# Patient Record
Sex: Female | Born: 1935 | Race: White | Hispanic: No | Smoking: Never smoker
Health system: Southern US, Community
[De-identification: ages and names within clinical notes are randomized; demographics above are authoritative.]

## PROBLEM LIST (undated history)

## (undated) DIAGNOSIS — E7800 Pure hypercholesterolemia, unspecified: Secondary | ICD-10-CM

## (undated) DIAGNOSIS — I10 Essential (primary) hypertension: Secondary | ICD-10-CM

## (undated) DIAGNOSIS — I219 Acute myocardial infarction, unspecified: Secondary | ICD-10-CM

## (undated) DIAGNOSIS — F419 Anxiety disorder, unspecified: Secondary | ICD-10-CM

## (undated) HISTORY — PX: OTHER SURGICAL HISTORY: SHX169

## (undated) HISTORY — PX: ABDOMINAL HYSTERECTOMY: SHX81

---

## 1999-08-08 ENCOUNTER — Ambulatory Visit (HOSPITAL_COMMUNITY): Admission: RE | Admit: 1999-08-08 | Discharge: 1999-08-08 | Payer: Self-pay | Admitting: Gastroenterology

## 2011-12-31 ENCOUNTER — Other Ambulatory Visit: Payer: Self-pay

## 2011-12-31 ENCOUNTER — Emergency Department (HOSPITAL_BASED_OUTPATIENT_CLINIC_OR_DEPARTMENT_OTHER)
Admission: EM | Admit: 2011-12-31 | Discharge: 2012-01-01 | Disposition: A | Discharge status: Home / Self Care | Payer: Medicare Other | Attending: Emergency Medicine | Admitting: Emergency Medicine

## 2011-12-31 ENCOUNTER — Encounter (HOSPITAL_BASED_OUTPATIENT_CLINIC_OR_DEPARTMENT_OTHER): Payer: Self-pay | Admitting: *Deleted

## 2011-12-31 DIAGNOSIS — Z882 Allergy status to sulfonamides status: Secondary | ICD-10-CM | POA: Insufficient documentation

## 2011-12-31 DIAGNOSIS — Z880 Allergy status to penicillin: Secondary | ICD-10-CM | POA: Insufficient documentation

## 2011-12-31 DIAGNOSIS — R002 Palpitations: Secondary | ICD-10-CM | POA: Insufficient documentation

## 2011-12-31 DIAGNOSIS — I252 Old myocardial infarction: Secondary | ICD-10-CM | POA: Insufficient documentation

## 2011-12-31 DIAGNOSIS — Z881 Allergy status to other antibiotic agents status: Secondary | ICD-10-CM | POA: Insufficient documentation

## 2011-12-31 DIAGNOSIS — F411 Generalized anxiety disorder: Secondary | ICD-10-CM | POA: Insufficient documentation

## 2011-12-31 DIAGNOSIS — I10 Essential (primary) hypertension: Secondary | ICD-10-CM | POA: Insufficient documentation

## 2011-12-31 HISTORY — DX: Essential (primary) hypertension: I10

## 2011-12-31 HISTORY — DX: Anxiety disorder, unspecified: F41.9

## 2011-12-31 HISTORY — DX: Pure hypercholesterolemia, unspecified: E78.00

## 2011-12-31 HISTORY — DX: Acute myocardial infarction, unspecified: I21.9

## 2011-12-31 NOTE — ED Notes (Signed)
Here with c.o fast heart beat. Heart rate is 82 at triage. States she feels nervous and may be having a panic attack.

## 2012-01-01 LAB — CBC WITH DIFFERENTIAL/PLATELET
Basophils Absolute: 0 10*3/uL (ref 0.0–0.1)
HCT: 34.3 % — ABNORMAL LOW (ref 36.0–46.0)
Lymphocytes Relative: 22 % (ref 12–46)
Monocytes Absolute: 0.8 10*3/uL (ref 0.1–1.0)
Neutro Abs: 5.1 10*3/uL (ref 1.7–7.7)
Platelets: 179 10*3/uL (ref 150–400)
RDW: 11.6 % (ref 11.5–15.5)
WBC: 8.3 10*3/uL (ref 4.0–10.5)

## 2012-01-01 LAB — TROPONIN I: Troponin I: <0.3 ng/mL (ref <0.30)

## 2012-01-01 LAB — BASIC METABOLIC PANEL
Chloride: 101 mEq/L (ref 96–112)
Creatinine, Ser: 0.9 mg/dL (ref 0.50–1.10)
GFR calc Af Amer: 70 mL/min — ABNORMAL LOW (ref >90)
Sodium: 137 mEq/L (ref 135–145)

## 2012-01-01 NOTE — ED Notes (Signed)
Pt left prior to receiving discharge instructions- family present to drive

## 2012-01-01 NOTE — ED Provider Notes (Signed)
History   This chart was scribed for Toni Seamen, MD by Sofie Rower. The patient was seen in room MH06/MH06 and the patient's care was started at 12:40AM.    CSN: [id number]  Arrival date & time 12/31/11  2237   None     Chief Complaint  Patient presents with  . Palpitations    (Consider location/radiation/quality/duration/timing/severity/associated sxs/prior treatment) Patient is a 78 y.o. female presenting with palpitations. The history is provided by the patient. No language interpreter was used.  Palpitations  This is a new problem. The current episode started 3 to 5 hours ago. The problem occurs rarely. The problem has been resolved. The problem is associated with an unknown factor. Pertinent negatives include no fever and no dizziness. She has tried nothing for the symptoms. The treatment provided no relief.    Toni Castaneda is a 78 y.o. female , with a hx of MI, who presents to the Emergency Department complaining of sudden, progressively improving, tachycardia onset yesterday (9:00PM), with associated symptoms of nausea. The pt reports she experiencing a rapid heart rate yesterday, however, she did not measure her pulse rate. Modifying factors include taking metoprolol (25 mg) which provides moderate relief of the tachycardia. The pt has a hx of anxiety, cardiac stents, and hypertension. The pt denies chest pain, shortness of breath, nausea, vomiting, and sweats.   The pt does not smoke or drink alcohol.      Past Medical History  Diagnosis Date  . Hypertension   . High cholesterol   . MI (myocardial infarction)   . Anxiety     Past Surgical History  Procedure Date  . Abdominal hysterectomy   . Cardiac stents     No family history on file.  History  Substance Use Topics  . Smoking status: Never Smoker   . Smokeless tobacco: Not on file  . Alcohol Use: No    OB History    Grav Para Term Preterm Abortions TAB SAB Ect Mult Living                  Review  of Systems  Constitutional: Negative for fever.  Cardiovascular: Positive for palpitations.  Neurological: Negative for dizziness.  All other systems reviewed and are negative.    Allergies  Amoxicillin; Penicillins; Sulfa antibiotics; and Tetracyclines & related  Home Medications   Current Outpatient Rx  Name Route Sig Dispense Refill  . AMLODIPINE BESYLATE 2.5 MG PO TABS Oral Take 2.5 mg by mouth daily.    . ASPIRIN 81 MG PO TABS Oral Take 81 mg by mouth daily.    Marland Kitchen CETIRIZINE HCL 10 MG PO TABS Oral Take 10 mg by mouth daily.    Marland Kitchen DIPHENOXYLATE-ATROPINE 2.5-0.025 MG PO TABS Oral Take 1 tablet by mouth 4 (four) times daily as needed.    Marland Kitchen LISINOPRIL 40 MG PO TABS Oral Take 40 mg by mouth daily. Patient uses 20 mg in the am, and 20 mg in the pm.    . LORAZEPAM 1 MG PO TABS Oral Take 1 mg by mouth every 8 (eight) hours.    Marland Kitchen METOPROLOL TARTRATE 25 MG PO TABS Oral Take 25 mg by mouth 2 (two) times daily.    Marland Kitchen PANTOPRAZOLE SODIUM 40 MG PO TBEC Oral Take 40 mg by mouth daily.    Marland Kitchen ROSUVASTATIN CALCIUM 10 MG PO TABS Oral Take 10 mg by mouth daily.      BP 162/81  Pulse 82  Temp 97.9 F (36.6  C) (Oral)  Resp 18  SpO2 98%  Physical Exam  Nursing note and vitals reviewed. Constitutional: She appears well-developed and well-nourished.  HENT:  Head: Atraumatic.  Nose: Nose normal.  Eyes: Conjunctivae normal and EOM are normal. Pupils are equal, round, and reactive to light.  Neck: Normal range of motion.  Cardiovascular: Normal rate and normal heart sounds.   No murmur heard. Pulmonary/Chest: Effort normal and breath sounds normal.  Abdominal: Soft. Bowel sounds are normal. There is no tenderness.  Musculoskeletal: Normal range of motion.  Neurological: She is alert.  Skin: Skin is warm and dry.  Psychiatric: She has a normal mood and affect. Her behavior is normal.    ED Course  Procedures (including critical care time)  DIAGNOSTIC STUDIES: Oxygen Saturation is 98% on  room air, normal by my interpretation.    COORDINATION OF CARE:    12:44AM- EKG results and continued evaluation discussed. Pt agrees with treatment.      MDM   Nursing notes and vitals signs, including pulse oximetry, reviewed.  Summary of this visit's results, reviewed by myself:  Labs:  Results for orders placed during the hospital encounter of 12/31/11  TROPONIN I      Component Value Range   Troponin I <0.30  <0.30 ng/mL  BASIC METABOLIC PANEL      Component Value Range   Sodium 137  135 - 145 mEq/L   Potassium 4.0  3.5 - 5.1 mEq/L   Chloride 101  96 - 112 mEq/L   CO2 27  19 - 32 mEq/L   Glucose, Bld 111 (*) 70 - 99 mg/dL   BUN 15  6 - 23 mg/dL   Creatinine, Ser 1.61  0.50 - 1.10 mg/dL   Calcium 9.3  8.4 - 09.6 mg/dL   GFR calc non Af Amer 61 (*) >90 mL/min   GFR calc Af Amer 70 (*) >90 mL/min  CBC WITH DIFFERENTIAL      Component Value Range   WBC 8.3  4.0 - 10.5 K/uL   RBC 3.63 (*) 3.87 - 5.11 MIL/uL   Hemoglobin 11.8 (*) 12.0 - 15.0 g/dL   HCT 04.5 (*) 40.9 - 81.1 %   MCV 94.5  78.0 - 100.0 fL   MCH 32.5  26.0 - 34.0 pg   MCHC 34.4  30.0 - 36.0 g/dL   RDW 91.4  78.2 - 95.6 %   Platelets 179  150 - 400 K/uL   Neutrophils Relative 62  43 - 77 %   Neutro Abs 5.1  1.7 - 7.7 K/uL   Lymphocytes Relative 22  12 - 46 %   Lymphs Abs 1.8  0.7 - 4.0 K/uL   Monocytes Relative 10  3 - 12 %   Monocytes Absolute 0.8  0.1 - 1.0 K/uL   Eosinophils Relative 6 (*) 0 - 5 %   Eosinophils Absolute 0.5  0.0 - 0.7 K/uL   Basophils Relative 0  0 - 1 %   Basophils Absolute 0.0  0.0 - 0.1 K/uL   1:56 AM Asymptomatic in ED. Suspect SVT given history.   EKG Interpretation:  Date & Time: 12/31/2011 10:53 PM  Rate: 81  Rhythm: normal sinus rhythm  QRS Axis: normal  Intervals: normal  ST/T Wave abnormalities: normal  Conduction Disutrbances:none  Narrative Interpretation:   Old EKG Reviewed: none available        I personally performed the services described in  this documentation, which was scribed in my presence.  The  recorded information has been reviewed and considered.    Toni Seamen, MD 01/01/12 [phone]

## 2012-09-06 ENCOUNTER — Emergency Department (HOSPITAL_BASED_OUTPATIENT_CLINIC_OR_DEPARTMENT_OTHER): Payer: Medicare Other

## 2012-09-06 ENCOUNTER — Emergency Department (HOSPITAL_BASED_OUTPATIENT_CLINIC_OR_DEPARTMENT_OTHER)
Admission: EM | Admit: 2012-09-06 | Discharge: 2012-09-06 | Disposition: A | Discharge status: Home / Self Care | Payer: Medicare Other | Attending: Emergency Medicine | Admitting: Emergency Medicine

## 2012-09-06 ENCOUNTER — Encounter (HOSPITAL_BASED_OUTPATIENT_CLINIC_OR_DEPARTMENT_OTHER): Payer: Self-pay | Admitting: *Deleted

## 2012-09-06 DIAGNOSIS — Y929 Unspecified place or not applicable: Secondary | ICD-10-CM | POA: Insufficient documentation

## 2012-09-06 DIAGNOSIS — Z7982 Long term (current) use of aspirin: Secondary | ICD-10-CM | POA: Insufficient documentation

## 2012-09-06 DIAGNOSIS — Y9389 Activity, other specified: Secondary | ICD-10-CM | POA: Insufficient documentation

## 2012-09-06 DIAGNOSIS — X500XXA Overexertion from strenuous movement or load, initial encounter: Secondary | ICD-10-CM | POA: Insufficient documentation

## 2012-09-06 DIAGNOSIS — S93401A Sprain of unspecified ligament of right ankle, initial encounter: Secondary | ICD-10-CM

## 2012-09-06 DIAGNOSIS — Z79899 Other long term (current) drug therapy: Secondary | ICD-10-CM | POA: Insufficient documentation

## 2012-09-06 DIAGNOSIS — S93409A Sprain of unspecified ligament of unspecified ankle, initial encounter: Secondary | ICD-10-CM | POA: Insufficient documentation

## 2012-09-06 DIAGNOSIS — Z9861 Coronary angioplasty status: Secondary | ICD-10-CM | POA: Insufficient documentation

## 2012-09-06 DIAGNOSIS — I252 Old myocardial infarction: Secondary | ICD-10-CM | POA: Insufficient documentation

## 2012-09-06 DIAGNOSIS — Z880 Allergy status to penicillin: Secondary | ICD-10-CM | POA: Insufficient documentation

## 2012-09-06 DIAGNOSIS — E7800 Pure hypercholesterolemia, unspecified: Secondary | ICD-10-CM | POA: Insufficient documentation

## 2012-09-06 DIAGNOSIS — I10 Essential (primary) hypertension: Secondary | ICD-10-CM | POA: Insufficient documentation

## 2012-09-06 DIAGNOSIS — F411 Generalized anxiety disorder: Secondary | ICD-10-CM | POA: Insufficient documentation

## 2012-09-06 NOTE — ED Notes (Signed)
I placed an ASO on the patient's left ankle. There isn't anywhere to charge for an ASO in the charge capture, under the supplies.

## 2012-09-06 NOTE — ED Notes (Signed)
Pt states she injured her left ankle coming down the steps. Swelling and bruising noted. PMS intact.

## 2012-09-06 NOTE — ED Provider Notes (Signed)
History     CSN: [id number]  Arrival date & time 09/06/12  1626   First MD Initiated Contact with Patient 09/06/12 1754      Chief Complaint  Patient presents with  . Ankle Injury    (Consider location/radiation/quality/duration/timing/severity/associated sxs/prior treatment) Patient is a 79 y.o. female presenting with lower extremity injury. The history is provided by the patient. No language interpreter was used.  Ankle Injury This is a new problem. The current episode started today. The problem occurs constantly. The problem has been gradually worsening. Associated symptoms include joint swelling. Nothing aggravates the symptoms. She has tried nothing for the symptoms. The treatment provided moderate relief.   Pt complains of pain in her left ankle.  Pt reports she missed a step and turned ankle. Past Medical History  Diagnosis Date  . Hypertension   . High cholesterol   . MI (myocardial infarction)   . Anxiety     Past Surgical History  Procedure Laterality Date  . Abdominal hysterectomy    . Cardiac stents      History reviewed. No pertinent family history.  History  Substance Use Topics  . Smoking status: Never Smoker   . Smokeless tobacco: Not on file  . Alcohol Use: No    OB History   Grav Para Term Preterm Abortions TAB SAB Ect Mult Living                  Review of Systems  Musculoskeletal: Positive for joint swelling.  All other systems reviewed and are negative.    Allergies  Amoxicillin; Penicillins; Sulfa antibiotics; and Tetracyclines & related  Home Medications   Current Outpatient Rx  Name  Route  Sig  Dispense  Refill  . amLODipine (NORVASC) 2.5 MG tablet   Oral   Take 2.5 mg by mouth daily.         Marland Kitchen aspirin 81 MG tablet   Oral   Take 81 mg by mouth daily.         . cetirizine (ZYRTEC) 10 MG tablet   Oral   Take 10 mg by mouth daily.         . diphenoxylate-atropine (LOMOTIL) 2.5-0.025 MG per tablet   Oral   Take 1  tablet by mouth 4 (four) times daily as needed.         Marland Kitchen lisinopril (PRINIVIL,ZESTRIL) 40 MG tablet   Oral   Take 40 mg by mouth daily. Patient uses 20 mg in the am, and 20 mg in the pm.         . LORazepam (ATIVAN) 1 MG tablet   Oral   Take 1 mg by mouth every 8 (eight) hours.         . metoprolol tartrate (LOPRESSOR) 25 MG tablet   Oral   Take 25 mg by mouth 2 (two) times daily.         . pantoprazole (PROTONIX) 40 MG tablet   Oral   Take 40 mg by mouth daily.         . rosuvastatin (CRESTOR) 10 MG tablet   Oral   Take 10 mg by mouth daily.           BP 138/114  Pulse 74  Temp(Src) 98.4 F (36.9 C) (Oral)  Resp 20  Ht 5\' 4"  (1.626 m)  Wt 117 lb (53.071 kg)  BMI 20.07 kg/m2  SpO2 97%  Physical Exam  Nursing note and vitals reviewed. Constitutional: She appears well-developed and well-nourished.  HENT:  Head: Normocephalic.  Cardiovascular: Regular rhythm.   Musculoskeletal: She exhibits tenderness.  Swollen tender left ankle, from,  Bruised, nv and ns intact  Neurological: She is alert.  Skin: Skin is warm.    ED Course  Procedures (including critical care time)  Labs Reviewed - No data to display Dg Ankle Complete Left  09/06/2012   *RADIOLOGY REPORT*  Clinical Data: Lateral left ankle pain following a twisting injury today.  LEFT ANKLE COMPLETE - 3+ VIEW  Comparison: None.  Findings: Lateral soft tissue swelling.  No fracture, dislocation or effusion seen.  IMPRESSION: No fracture.   Original Report Authenticated By: Beckie Salts, M.D.     No diagnosis found.    MDM  Pt placed in aso.  Pt advised to follow up with Dr. Pearletha Forge for recheck in 1 week. Ice to area of pain        Elson Areas, [place] 09/06/12 1914

## 2012-09-07 NOTE — ED Provider Notes (Signed)
Medical screening examination/treatment/procedure(s) were performed by non-physician practitioner and as supervising physician I was immediately available for consultation/collaboration.  Clova Morlock M Tykeisha Peer, MD 09/07/12 1442 

## 2012-09-11 ENCOUNTER — Encounter: Payer: Self-pay | Admitting: Family Medicine

## 2012-09-11 ENCOUNTER — Ambulatory Visit (INDEPENDENT_AMBULATORY_CARE_PROVIDER_SITE_OTHER): Payer: Medicare Other | Admitting: Family Medicine

## 2012-09-11 VITALS — BP 112/71 | HR 62 | Ht 64.0 in | Wt 117.0 lb

## 2012-09-11 DIAGNOSIS — S99929A Unspecified injury of unspecified foot, initial encounter: Secondary | ICD-10-CM

## 2012-09-11 DIAGNOSIS — S99912A Unspecified injury of left ankle, initial encounter: Secondary | ICD-10-CM

## 2012-09-11 NOTE — Patient Instructions (Addendum)
You have a Grade 2 (likely 3) ankle sprain. Ice the area for 15 minutes at a time, 3-4 times a day Tylenol 500mg  1-2 tabs three times a day as needed for pain. Elevate above the level of your heart when possible Crutches if needed to help with walking Bear weight when tolerated Wear laceup ankle brace to help with stability while you recover from this injury. Come out of the brace twice a day to do Up/down and alphabet exercises 2-3 sets of each. Consider physical therapy for strengthening and balance exercises in the future. Follow up with me in 2 weeks for reevaluation.

## 2012-09-12 ENCOUNTER — Encounter: Payer: Self-pay | Admitting: Family Medicine

## 2012-09-12 DIAGNOSIS — S99912A Unspecified injury of left ankle, initial encounter: Secondary | ICD-10-CM | POA: Insufficient documentation

## 2012-09-12 NOTE — Progress Notes (Signed)
Subjective:    Patient ID: Toni Castaneda, female    DOB: 1933-05-12, 79 y.o.   MRN: [id number]  PCP: Dr. Derrell Lolling  HPI 79 yo F here for left ankle injury.  [place] She thought she was stepping forward onto level ground when in fact there was a 1 step dropoff. This caused her to invert left ankle. Heard a loud pop - could put some weight on it initially. + swelling and bruising. History of a bad sprain same ankle 22 years ago - was placed in a cast then and was on crutches for at least a couple months. She plans to get a walker to help with ambulation - not using crutches or anything now. Has an ASO - using for stability. Tylenol as needed for pain.  Past Medical History  Diagnosis Date  . Hypertension   . High cholesterol   . MI (myocardial infarction)   . Anxiety     Current Outpatient Prescriptions on File Prior to Visit  Medication Sig Dispense Refill  . amLODipine (NORVASC) 2.5 MG tablet Take 2.5 mg by mouth daily.      Marland Kitchen aspirin 81 MG tablet Take 81 mg by mouth daily.      Marland Kitchen lisinopril (PRINIVIL,ZESTRIL) 40 MG tablet Take 40 mg by mouth daily. Patient uses 20 mg in the am, and 20 mg in the pm.      . LORazepam (ATIVAN) 1 MG tablet Take 1 mg by mouth every 8 (eight) hours.      . pantoprazole (PROTONIX) 40 MG tablet Take 40 mg by mouth daily.      . rosuvastatin (CRESTOR) 10 MG tablet Take 10 mg by mouth daily.      . cetirizine (ZYRTEC) 10 MG tablet Take 10 mg by mouth daily.      . diphenoxylate-atropine (LOMOTIL) 2.5-0.025 MG per tablet Take 1 tablet by mouth 4 (four) times daily as needed.      . metoprolol tartrate (LOPRESSOR) 25 MG tablet Take 25 mg by mouth 2 (two) times daily.       No current facility-administered medications on file prior to visit.    Past Surgical History  Procedure Laterality Date  . Abdominal hysterectomy    . Cardiac stents      Allergies  Allergen Reactions  . Amoxicillin   . Codeine    . Penicillins   . Sulfa Antibiotics   . Tetracyclines & Related     History   Social History  . Marital Status: Widowed    Spouse Name: N/A    Number of Children: N/A  . Years of Education: N/A   Occupational History  . Not on file.   Social History Main Topics  . Smoking status: Never Smoker   . Smokeless tobacco: Not on file  . Alcohol Use: No  . Drug Use: No  . Sexually Active: Not on file   Other Topics Concern  . Not on file   Social History Narrative  . No narrative on file    History reviewed. No pertinent family history.  BP 112/71  Pulse 62  Ht 5\' 4"  (1.626 m)  Wt 117 lb (53.071 kg)  BMI 20.07 kg/m2  Review of Systems See HPI above.    Objective:   Physical Exam Gen: NAD  L ankle: Mod swelling and bruising laterally. Mod limitation ROM all directions. TTP greatest at ATFL though has diffuse pain dorsal foot, lateral ankle including  lateral malleolus but not 5th metatarsal, navicular, medial malleolus.  No fibular head TTP. 2+ painful ant drawer, talar tilt. + syndesmotic compression. Thompsons test negative. NV intact distally.    Assessment & Plan:  1. Left ankle injury - 2/2 severe ankle sprain.  Radiographs negative for fracture.  Start with ROM exercises.  Icing, elevation, tylenol.  Going to use walker to help with ambulation.  ASO for stability.  F/u in 2 weeks for reevaluation.  Consider PT, adding HEP at f/u if improving.

## 2012-09-12 NOTE — Assessment & Plan Note (Signed)
2/2 severe ankle sprain.  Radiographs negative for fracture.  Start with ROM exercises.  Icing, elevation, tylenol.  Going to use walker to help with ambulation.  ASO for stability.  F/u in 2 weeks for reevaluation.  Consider PT, adding HEP at f/u if improving.

## 2012-09-25 ENCOUNTER — Encounter: Payer: Self-pay | Admitting: Family Medicine

## 2012-09-25 ENCOUNTER — Ambulatory Visit (INDEPENDENT_AMBULATORY_CARE_PROVIDER_SITE_OTHER): Payer: Medicare Other | Admitting: Family Medicine

## 2012-09-25 VITALS — BP 114/70 | HR 76

## 2012-09-25 DIAGNOSIS — S99912D Unspecified injury of left ankle, subsequent encounter: Secondary | ICD-10-CM

## 2012-09-25 DIAGNOSIS — Z5189 Encounter for other specified aftercare: Secondary | ICD-10-CM

## 2012-09-25 NOTE — Patient Instructions (Addendum)
You have a Grade 3 ankle sprain. Ice the area for 15 minutes at a time, 3-4 times a day as needed. Tylenol 500mg  1-2 tabs three times a day as needed for pain. Elevate above the level of your heart when possible Wear laceup ankle brace to help with stability for next 3 weeks when up and walking around. Start theraband strengthening exercises 3 sets of 10 each direction once a day. Can start some of the balance exercises in the handout too in about 1 week. Follow up with me in 4 weeks for reevaluation.

## 2012-09-26 ENCOUNTER — Encounter: Payer: Self-pay | Admitting: Family Medicine

## 2012-09-26 NOTE — Assessment & Plan Note (Signed)
2/2 severe ankle sprain.  Radiographs negative for fracture.  ROM much better and swelling down as well.  Continue tylenol as needed.  ASO for stability.  Start strengthening exercises which were given today.  Wait a week before doing balance exercises.  F/u in 4 weeks for reevaluation.

## 2012-09-26 NOTE — Progress Notes (Signed)
Subjective:    Patient ID: Toni Castaneda, female    DOB: May 12, 1933, 79 y.o.   MRN: [id number]  PCP: Dr. Derrell Lolling  HPI  79 yo F here for f/u left ankle injury.  5/29: Patient [place] She thought she was stepping forward onto level ground when in fact there was a 1 step dropoff. This caused her to invert left ankle. Heard a loud pop - could put some weight on it initially. + swelling and bruising. History of a bad sprain same ankle 22 years ago - was placed in a cast then and was on crutches for at least a couple months. She plans to get a walker to help with ambulation - not using crutches or anything now. Has an ASO - using for stability. Tylenol as needed for pain.  6/12: Patient reports she does feel improved from last visit. Still with pain lateral and medial ankle. Doing home ROM exercises. Elevating and using ankle brace. No longer icing or using crutches. Takes tylenol as needed.  Past Medical History  Diagnosis Date  . Hypertension   . High cholesterol   . MI (myocardial infarction)   . Anxiety     Current Outpatient Prescriptions on File Prior to Visit  Medication Sig Dispense Refill  . amLODipine (NORVASC) 2.5 MG tablet Take 2.5 mg by mouth daily.      Marland Kitchen aspirin 81 MG tablet Take 81 mg by mouth daily.      . cetirizine (ZYRTEC) 10 MG tablet Take 10 mg by mouth daily.      . citalopram (CELEXA) 10 MG tablet       . diphenoxylate-atropine (LOMOTIL) 2.5-0.025 MG per tablet Take 1 tablet by mouth 4 (four) times daily as needed.      Marland Kitchen lisinopril (PRINIVIL,ZESTRIL) 40 MG tablet Take 40 mg by mouth daily. Patient uses 20 mg in the am, and 20 mg in the pm.      . LORazepam (ATIVAN) 1 MG tablet Take 1 mg by mouth every 8 (eight) hours.      . metoprolol tartrate (LOPRESSOR) 25 MG tablet Take 25 mg by mouth 2 (two) times daily.      . pantoprazole (PROTONIX) 40 MG tablet Take 40 mg by mouth daily.      Marland Kitchen PREMARIN vaginal cream        . rosuvastatin (CRESTOR) 10 MG tablet Take 10 mg by mouth daily.       No current facility-administered medications on file prior to visit.    Past Surgical History  Procedure Laterality Date  . Abdominal hysterectomy    . Cardiac stents      Allergies  Allergen Reactions  . Amoxicillin   . Codeine   . Penicillins   . Sulfa Antibiotics   . Tetracyclines & Related     History   Social History  . Marital Status: Widowed    Spouse Name: N/A    Number of Children: N/A  . Years of Education: N/A   Occupational History  . Not on file.   Social History Main Topics  . Smoking status: Never Smoker   . Smokeless tobacco: Not on file  . Alcohol Use: No  . Drug Use: No  . Sexually Active: Not on file   Other Topics Concern  . Not on file   Social History Narrative  . No narrative on file    History reviewed. No pertinent family history.  BP 114/70  Pulse 76  Review of Systems  See HPI above.    Objective:   Physical Exam  Gen: NAD  L ankle: Minimal swelling and bruising laterally - much improved. FROM - also improved. TTP ATFL, over medial and lateral ankle below and post to malleoli as well.  No malleolus, base 5th metatarsal, navicular TTP.  No fibular head TTP. 2+ ant drawer, talar tilt but less pain. + syndesmotic compression. Thompsons test negative. NV intact distally.    Assessment & Plan:  1. Left ankle injury - 2/2 severe ankle sprain.  Radiographs negative for fracture.  ROM much better and swelling down as well.  Continue tylenol as needed.  ASO for stability.  Start strengthening exercises which were given today.  Wait a week before doing balance exercises.  F/u in 4 weeks for reevaluation.

## 2012-10-27 ENCOUNTER — Encounter: Payer: Self-pay | Admitting: Family Medicine

## 2012-10-27 ENCOUNTER — Ambulatory Visit (INDEPENDENT_AMBULATORY_CARE_PROVIDER_SITE_OTHER): Payer: Medicare Other | Admitting: Family Medicine

## 2012-10-27 VITALS — BP 121/77 | HR 65 | Ht 63.0 in | Wt 117.0 lb

## 2012-10-27 DIAGNOSIS — S99912D Unspecified injury of left ankle, subsequent encounter: Secondary | ICD-10-CM

## 2012-10-27 DIAGNOSIS — Z5189 Encounter for other specified aftercare: Secondary | ICD-10-CM

## 2012-10-27 NOTE — Assessment & Plan Note (Signed)
2/2 severe ankle sprain.  Much improved 6 weeks out.  Continue home exercises for another 4-6 weeks, 3 times a week.  Follow up as needed.

## 2012-10-27 NOTE — Patient Instructions (Addendum)
Advised to do home exercises for 4-6 more weeks, 3 times a week.

## 2012-10-27 NOTE — Progress Notes (Signed)
Subjective:    Patient ID: Toni Castaneda, female    DOB: 04-Apr-1934, 79 y.o.   MRN: [id number]  PCP: Dr. Derrell Lolling  HPI  79 yo F here for f/u left ankle injury.  5/29: Patient [place] She thought she was stepping forward onto level ground when in fact there was a 1 step dropoff. This caused her to invert left ankle. Heard a loud pop - could put some weight on it initially. + swelling and bruising. History of a bad sprain same ankle 22 years ago - was placed in a cast then and was on crutches for at least a couple months. She plans to get a walker to help with ambulation - not using crutches or anything now. Has an ASO - using for stability. Tylenol as needed for pain.  6/12: Patient reports she does feel improved from last visit. Still with pain lateral and medial ankle. Doing home ROM exercises. Elevating and using ankle brace. No longer icing or using crutches. Takes tylenol as needed.  7/14: Patient reports she is doing much better. Wearing sandals today - no longer using ASO regularly - seems to lead to more swelling. Not taking any medicines. Able to work out at gym regularly. Icing occasionally. Doing home exercises also. Gets pain lateral ankle at times worse at end of day.  Past Medical History  Diagnosis Date  . Hypertension   . High cholesterol   . MI (myocardial infarction)   . Anxiety     Current Outpatient Prescriptions on File Prior to Visit  Medication Sig Dispense Refill  . amLODipine (NORVASC) 2.5 MG tablet Take 2.5 mg by mouth daily.      Marland Kitchen aspirin 81 MG tablet Take 81 mg by mouth daily.      . cetirizine (ZYRTEC) 10 MG tablet Take 10 mg by mouth daily.      . citalopram (CELEXA) 10 MG tablet       . diphenoxylate-atropine (LOMOTIL) 2.5-0.025 MG per tablet Take 1 tablet by mouth 4 (four) times daily as needed.      Marland Kitchen lisinopril (PRINIVIL,ZESTRIL) 40 MG tablet Take 40 mg by mouth daily. Patient uses 20 mg in  the am, and 20 mg in the pm.      . LORazepam (ATIVAN) 1 MG tablet Take 1 mg by mouth every 8 (eight) hours.      . metoprolol tartrate (LOPRESSOR) 25 MG tablet Take 25 mg by mouth 2 (two) times daily.      . pantoprazole (PROTONIX) 40 MG tablet Take 40 mg by mouth daily.      Marland Kitchen PREMARIN vaginal cream       . rosuvastatin (CRESTOR) 10 MG tablet Take 10 mg by mouth daily.       No current facility-administered medications on file prior to visit.    Past Surgical History  Procedure Laterality Date  . Abdominal hysterectomy    . Cardiac stents      Allergies  Allergen Reactions  . Amoxicillin   . Codeine   . Penicillins   . Sulfa Antibiotics   . Tetracyclines & Related     History   Social History  . Marital Status: Widowed    Spouse Name: N/A    Number of Children: N/A  . Years of Education: N/A   Occupational History  . Not on file.   Social History Main Topics  . Smoking status: Never Smoker   . Smokeless tobacco:  Not on file  . Alcohol Use: No  . Drug Use: No  . Sexually Active: Not on file   Other Topics Concern  . Not on file   Social History Narrative  . No narrative on file    History reviewed. No pertinent family history.  BP 121/77  Pulse 65  Ht 5\' 3"  (1.6 m)  Wt 117 lb (53.071 kg)  BMI 20.73 kg/m2  Review of Systems  See HPI above.    Objective:   Physical Exam  Gen: NAD  L ankle: No swelling, bruising, deformity. FROM with 5/5 strength all directions, without pain. Mild TTP ATFL.  No malleolar, base 5th metatarsal, navicular TTP.  No fibular head TTP. Trace ant drawer, talar tilt without pain. Negative syndesmotic compression. Thompsons test negative. NV intact distally.    Assessment & Plan:  1. Left ankle injury - 2/2 severe ankle sprain.  Much improved 6 weeks out.  Continue home exercises for another 4-6 weeks, 3 times a week.  Follow up as needed.

## 2014-04-30 IMAGING — CR DG ANKLE COMPLETE 3+V*L*
3 series · 3 of 3 positions shown · non-contrast
Comparison: None.

CLINICAL DATA: Lateral left ankle pain following a twisting injury
today.

LEFT ANKLE COMPLETE - 3+ VIEW

[t ankle joint ap left]
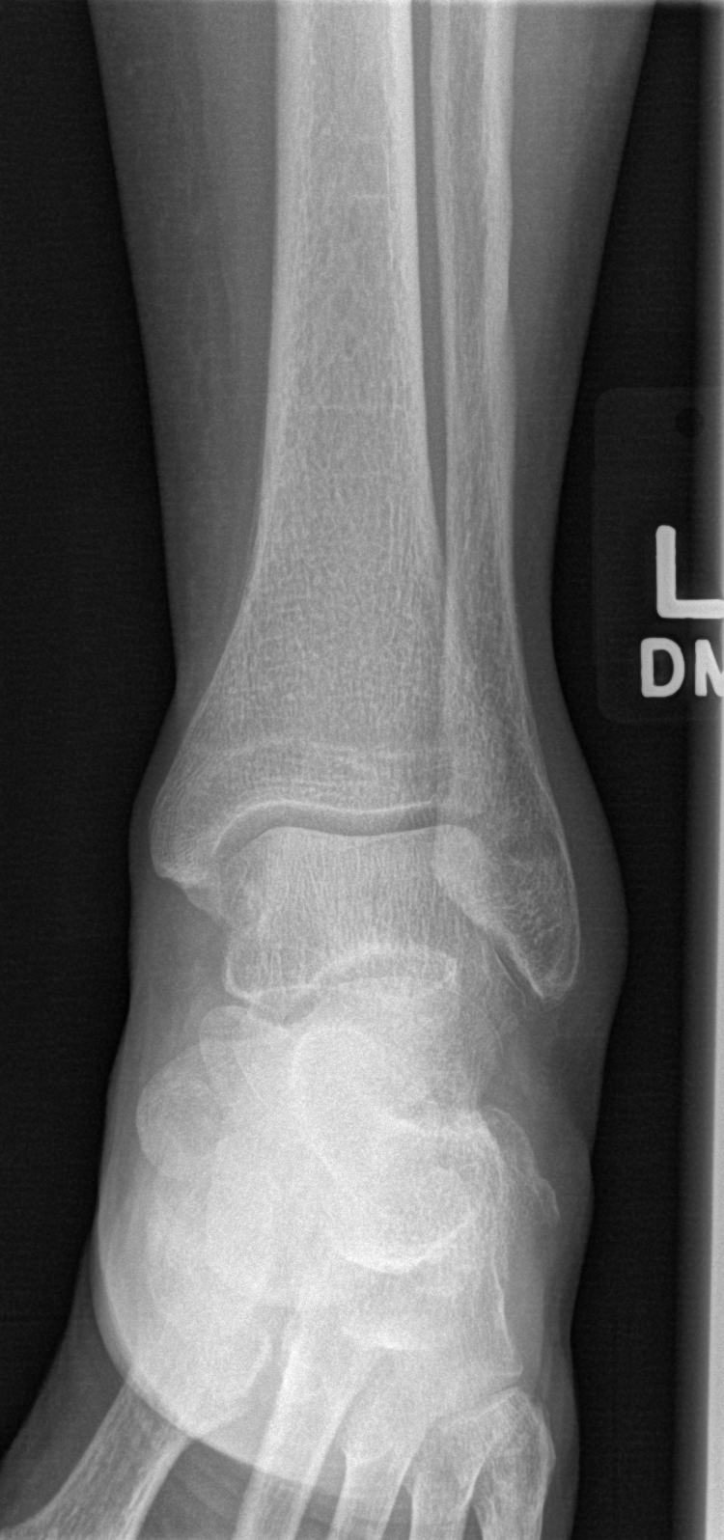

[t ankle joint oblique left]
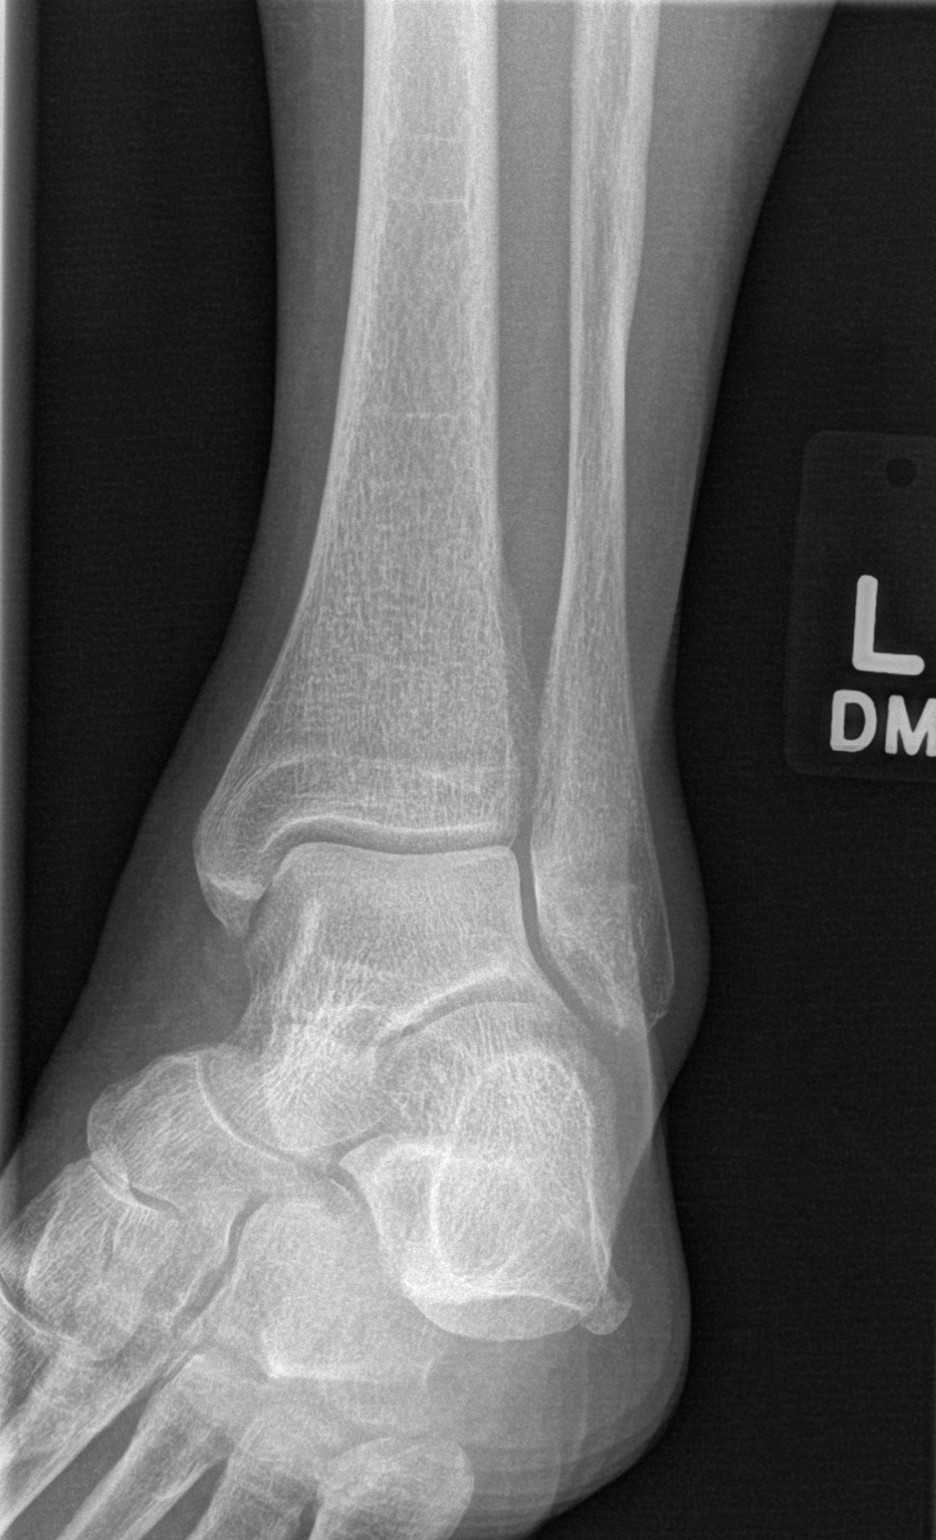

[t ankle joint lat left]
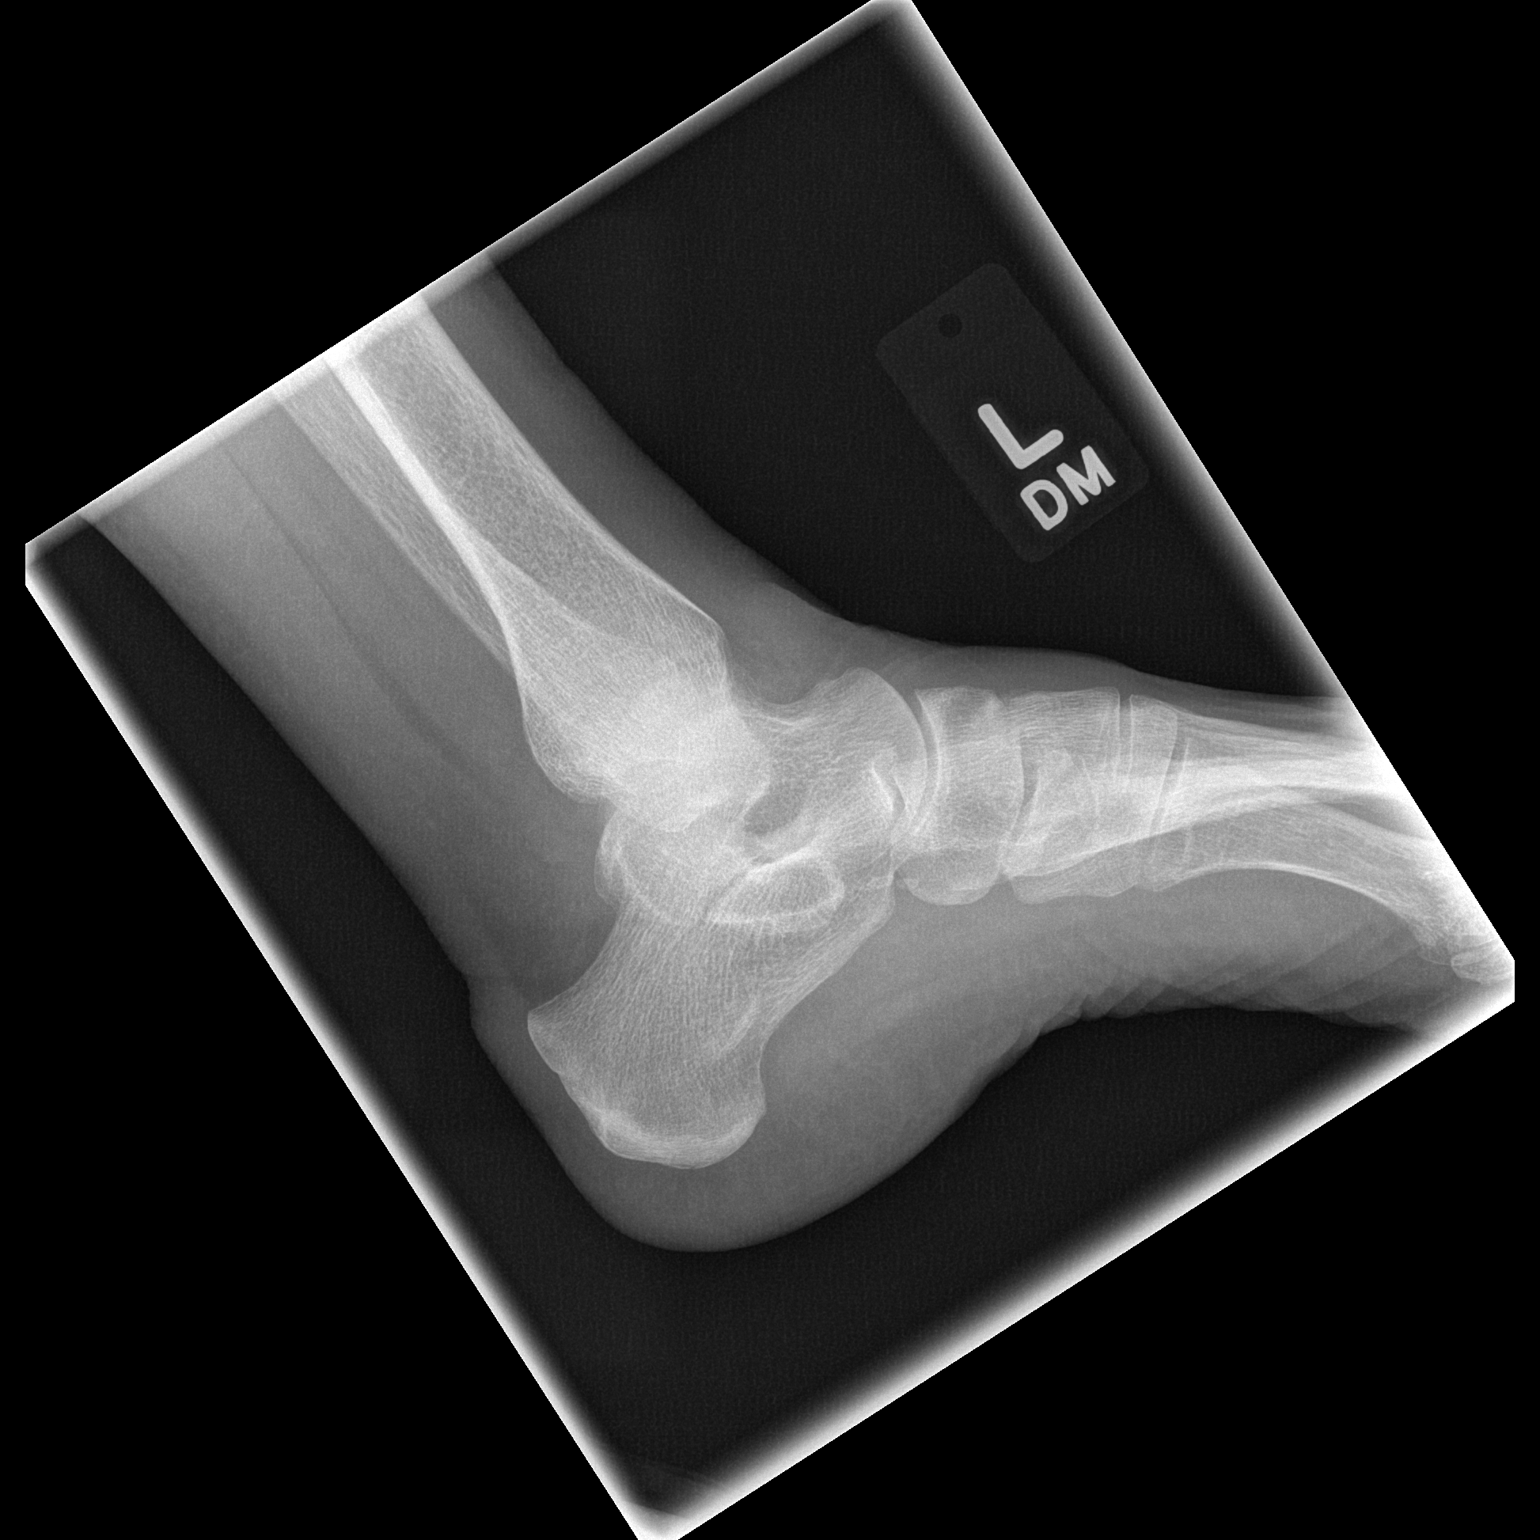

[3 of 3 positions shown; findings below may reference images not displayed]

FINDINGS: Lateral soft tissue swelling.  No fracture, dislocation
or effusion seen.
IMPRESSION: No fracture.

## 2019-11-22 ENCOUNTER — Emergency Department (HOSPITAL_BASED_OUTPATIENT_CLINIC_OR_DEPARTMENT_OTHER): Payer: Medicare Other

## 2019-11-22 ENCOUNTER — Encounter (HOSPITAL_BASED_OUTPATIENT_CLINIC_OR_DEPARTMENT_OTHER): Payer: Self-pay | Admitting: *Deleted

## 2019-11-22 ENCOUNTER — Other Ambulatory Visit: Payer: Self-pay

## 2019-11-22 ENCOUNTER — Emergency Department (HOSPITAL_BASED_OUTPATIENT_CLINIC_OR_DEPARTMENT_OTHER)
Admission: EM | Admit: 2019-11-22 | Discharge: 2019-11-22 | Disposition: A | Discharge status: Home / Self Care | Payer: Medicare Other | Attending: Emergency Medicine | Admitting: Emergency Medicine

## 2019-11-22 DIAGNOSIS — Y999 Unspecified external cause status: Secondary | ICD-10-CM | POA: Diagnosis not present

## 2019-11-22 DIAGNOSIS — S76011A Strain of muscle, fascia and tendon of right hip, initial encounter: Secondary | ICD-10-CM

## 2019-11-22 DIAGNOSIS — Y929 Unspecified place or not applicable: Secondary | ICD-10-CM | POA: Diagnosis not present

## 2019-11-22 DIAGNOSIS — K5730 Diverticulosis of large intestine without perforation or abscess without bleeding: Secondary | ICD-10-CM | POA: Diagnosis not present

## 2019-11-22 DIAGNOSIS — I10 Essential (primary) hypertension: Secondary | ICD-10-CM | POA: Insufficient documentation

## 2019-11-22 DIAGNOSIS — W19XXXA Unspecified fall, initial encounter: Secondary | ICD-10-CM

## 2019-11-22 DIAGNOSIS — Y939 Activity, unspecified: Secondary | ICD-10-CM | POA: Diagnosis not present

## 2019-11-22 DIAGNOSIS — Z79899 Other long term (current) drug therapy: Secondary | ICD-10-CM | POA: Diagnosis not present

## 2019-11-22 DIAGNOSIS — S79911A Unspecified injury of right hip, initial encounter: Secondary | ICD-10-CM | POA: Diagnosis present

## 2019-11-22 DIAGNOSIS — W010XXA Fall on same level from slipping, tripping and stumbling without subsequent striking against object, initial encounter: Secondary | ICD-10-CM | POA: Insufficient documentation

## 2019-11-22 MED ORDER — ACETAMINOPHEN 325 MG PO TABS
650.0000 mg | ORAL_TABLET | Freq: Once | ORAL | Status: AC
  Administered 2019-11-22: 650 mg via ORAL
  Filled 2019-11-22: qty 2

## 2019-11-22 NOTE — ED Triage Notes (Signed)
Pt reports she fell at home on [facility] floor yesterday after playing with her dog. She has been able to ambulate. C/o pain in her right pelvis

## 2019-11-22 NOTE — Discharge Instructions (Signed)
Recommend follow-up with primary doctor regarding your falls, hip pain and the sigmoid diverticulosis.  If you develop worsening pain, inability to walk or other new concerning symptom, return to ER for reassessment.  Take Tylenol Motrin as needed for pain control.

## 2019-11-22 NOTE — ED Notes (Signed)
Pt family at bedside state Pt has had multiple falls in last year and has not had BM in last 3 days

## 2019-11-22 NOTE — ED Provider Notes (Signed)
[facility] EMERGENCY DEPARTMENT Provider Note   CSN: [id number] Arrival date & time: 11/22/19  1354     History Chief Complaint  Patient presents with   Toni Castaneda is a 86 y.o. female.  Presents here with concern for fall.  Fall occurred yesterday, tripped over her dog while visiting her daughter in [place].  Landed on right hip, has been able to ambulate since the fall but has had some pain with ambulation.  Still having some mild to moderate pain throughout the day today, worse with ambulation, radiates down right leg.  Alleviated with rest.  Not on blood thinners, denies head trauma.  HPI     Past Medical History:  Diagnosis Date   Anxiety    High cholesterol    Hypertension    MI (myocardial infarction) [facility])     Patient Active Problem List   Diagnosis Date Noted   Left ankle injury 09/12/2012    Past Surgical History:  Procedure Laterality Date   ABDOMINAL HYSTERECTOMY     Cardiac stents       OB History   No obstetric history on file.     No family history on file.  Social History   Tobacco Use   Smoking status: Never Smoker   Smokeless tobacco: Never Used  Substance Use Topics   Alcohol use: No   Drug use: No    Home Medications Prior to Admission medications   Medication Sig Start Date End Date Taking? Authorizing Provider  amLODipine (NORVASC) 2.5 MG tablet Take 2.5 mg by mouth daily.   Yes [provider]  aspirin 81 MG tablet Take 81 mg by mouth daily.   Yes [provider]  cetirizine (ZYRTEC) 10 MG tablet Take 10 mg by mouth daily.   Yes [provider]  citalopram (CELEXA) 10 MG tablet  09/02/12  Yes [provider]  lisinopril (PRINIVIL,ZESTRIL) 40 MG tablet Take 40 mg by mouth daily. Patient uses 20 mg in the am, and 20 mg in the pm.   Yes [provider]  metoprolol tartrate (LOPRESSOR) 25 MG tablet Take 25 mg by mouth 2 (two) times daily.   Yes [provider]  diphenoxylate-atropine (LOMOTIL) 2.5-0.025 MG per tablet Take 1 tablet by mouth 4 (four) times daily as needed.    [provider]  LORazepam (ATIVAN) 1 MG tablet Take 1 mg by mouth every 8 (eight) hours.    [provider]  pantoprazole (PROTONIX) 40 MG tablet Take 40 mg by mouth daily.    [provider]  PREMARIN vaginal cream  06/11/12   [provider]  rosuvastatin (CRESTOR) 10 MG tablet Take 10 mg by mouth daily.    [provider]    Allergies    Amoxicillin, Codeine, Penicillins, Sulfa antibiotics, and Tetracyclines & related  Review of Systems   Review of Systems  Constitutional: Negative for chills and fever.  HENT: Negative for ear pain and sore throat.   Eyes: Negative for pain and visual disturbance.  Respiratory: Negative for cough and shortness of breath.   Cardiovascular: Negative for chest pain and palpitations.  Gastrointestinal: Negative for abdominal pain and vomiting.  Genitourinary: Negative for dysuria and hematuria.  Musculoskeletal: Positive for arthralgias. Negative for back pain.  Skin: Negative for color change and rash.  Neurological: Negative for seizures and syncope.  All other systems reviewed and are negative.   Physical Exam Updated Vital Signs BP 128/70 (BP Location:  Right Arm)    Pulse 63    Temp 98.6 F (37 C) (Oral)    Resp 16    Ht 5\' 3"  (1.6 m)    Wt 53.5 kg    SpO2 96%    BMI 20.90 kg/m   Physical Exam Vitals and nursing note reviewed.  Constitutional:      General: She is not in acute distress.    Appearance: She is well-developed.  HENT:     Head: Normocephalic and atraumatic.  Eyes:     Conjunctiva/sclera: Conjunctivae normal.  Cardiovascular:     Rate and Rhythm: Normal rate and regular rhythm.     Heart sounds: No murmur heard.   Pulmonary:     Effort: Pulmonary effort is normal. No respiratory distress.     Breath sounds: Normal breath sounds.  Abdominal:      Palpations: Abdomen is soft.     Tenderness: There is no abdominal tenderness.  Musculoskeletal:     Cervical back: Neck supple.     Comments: RUE: Tenderness to palpation noted over right humerus, there is no deformity noted, normal joint range of motion, distal sensation and pulses intact RLE: Tenderness to palpation over right lateral hip, normal range of motion, no deformity, distal sensation, pulses intact Back: no c, t, l spine tenderness or deformity  Skin:    General: Skin is warm and dry.  Neurological:     Mental Status: She is alert.     ED Results / Procedures / Treatments   Labs (all labs ordered are listed, but only abnormal results are displayed) Labs Reviewed - No data to display  EKG None  Radiology CT Hip Right Wo Contrast  Result Date: 11/22/2019 CLINICAL DATA:  Right hip pain after fall EXAM: CT OF THE RIGHT HIP WITHOUT CONTRAST TECHNIQUE: Multidetector CT imaging of the right hip was performed according to the standard protocol. Multiplanar CT image reconstructions were also generated. COMPARISON:  X-ray 11/22/2019 FINDINGS: Bones/Joint/Cartilage No acute fracture. No dislocation. Visualized portion of the right hemipelvis is intact. The pubic symphysis and visualized right SI joint are intact without diastasis. Right hip joint space is relatively well maintained. No right hip joint effusion. No suspicious lytic or sclerotic bone lesion. Ligaments Suboptimally assessed by CT. Muscles and Tendons Intact musculotendinous structures. Soft tissues No soft tissue edema or fluid collection. Extensive colonic diverticulosis within the visualized portion of the pelvis. Atherosclerotic vascular calcifications are present. No right inguinal lymphadenopathy. IMPRESSION: 1. No acute fracture or dislocation of the right hip. 2. Extensive sigmoid diverticulosis. Electronically Signed   By: 01/22/2020 D.O.   On: 11/22/2019 16:18   DG Humerus Right  Result Date:  11/22/2019 CLINICAL DATA:  RIGHT humerus pain after falling on hard floor yesterday. Pain in the mid humeral shaft to the shoulder. EXAM: RIGHT HUMERUS - 2+ VIEW COMPARISON:  None. FINDINGS: There is no evidence of fracture or other focal bone lesions. Soft tissues are unremarkable. IMPRESSION: Negative. Electronically Signed   By: 01/22/2020 M.D.   On: 11/22/2019 16:09   DG Hip Unilat  With Pelvis 2-3 Views Right  Result Date: 11/22/2019 CLINICAL DATA:  Fall, hip pain EXAM: DG HIP (WITH OR WITHOUT PELVIS) 2-3V RIGHT COMPARISON:  09/26/2016 FINDINGS: There is no evidence of hip fracture or dislocation. There is no evidence of arthropathy or other focal bone abnormality. IMPRESSION: Negative. Electronically Signed   By: 09/28/2016 M.D.   On: 11/22/2019 15:07    Procedures Procedures (including  critical care time)  Medications Ordered in ED Medications  acetaminophen (TYLENOL) tablet 650 mg (650 mg Oral Given 11/22/19 1549)    ED Course  I have reviewed the triage vital signs and the nursing notes.  Pertinent labs & imaging results that were available during my care of the patient were reviewed by me and considered in my medical decision making (see chart for details).    MDM Rules/Calculators/A&P                         86 year old fall presenting to ER with right hip pain, right upper arm pain.  Mechanical fall, no head trauma, no LOC.  Well-appearing on exam, no obvious deformity noted.  Plain films of hip negative, given her focal tenderness, proceeded with CT which was also negative.  Right humerus plain film negative.  Discussed findings including incidental sigmoid diverticulosis with patient, recommended follow-up with primary doctor.   After the discussed management above, the patient was determined to be safe for discharge.  The patient was in agreement with this plan and all questions regarding their care were answered.  ED return precautions were discussed and the patient will  return to the ED with any significant worsening of condition.  Final Clinical Impression(s) / ED Diagnoses Final diagnoses:  Fall, initial encounter  Strain of right hip, initial encounter  Sigmoid diverticulosis    Rx / DC Orders ED Discharge Orders    None       Milagros Loll, MD 11/22/19 1626

## 2021-11-10 NOTE — Therapy (Signed)
OUTPATIENT PHYSICAL THERAPY LOWER EXTREMITY EVALUATION   Patient Name: Toni Castaneda MRN: [id number] DOB:09-Aug-1933, 88 y.o., female Today's Date: 11/15/2021   PT End of Session - 11/15/21 1502     Visit Number 1    Number of Visits 16    Date for PT Re-Evaluation 01/10/22    Authorization Type [place] [employer] & [employer]    Progress Note Due on Visit 10    PT Start Time 1450    PT Stop Time 1530    PT Time Calculation (min) 40 min             Past Medical History:  Diagnosis Date   Anxiety    High cholesterol    Hypertension    MI (myocardial infarction) (HCC)    Past Surgical History:  Procedure Laterality Date   ABDOMINAL HYSTERECTOMY     Cardiac stents     Patient Active Problem List   Diagnosis Date Noted   Left ankle injury 09/12/2012    PCP: Malka So., MD  REFERRING PROVIDER: Bailey Mech, PA-C  REFERRING DIAG: [phone] (ICD-10-CM) - At risk for falling  THERAPY DIAG:  Unsteadiness on feet  Repeated falls  Difficulty in walking, not elsewhere classified  Muscle weakness (generalized)  Rationale for Evaluation and Treatment Rehabilitation  ONSET DATE: 10/13/2021  SUBJECTIVE:   SUBJECTIVE STATEMENT: Patient reports having a bad fall on 10/13/21, chipping her elbow.  She reports chronic feet problem, especially right side, and brought sneakers and ankle brace that the orthopedist gave her, but she has not been able to put them on. Her main concerns is that she feels like her legs wont support her, and she feels very unsteady without support, has to "cruise" around house.  She is currently living with daughter, but building new house, 1 level.   PERTINENT HISTORY: PMH: history MI, HTN, high cholesterol.  History of bil foot problems.    PAIN:  Are you having pain? No  PRECAUTIONS: Fall  WEIGHT BEARING RESTRICTIONS No  FALLS:  Has patient fallen in last 6 months? Yes. Number of falls 1 (family   LIVING ENVIRONMENT: Lives with:  lives with their family Lives in: House/apartment Stairs: No (2 steps to get into sunroom) Has following equipment at home: Single point cane and Environmental consultant - 2 wheeled  OCCUPATION: retired, Audiological scientist estate  PLOF: Independent  PATIENT GOALS "I just want to be able to walk like I used to."   OBJECTIVE:   DIAGNOSTIC FINDINGS: XR foot 10/25/2021 Right foot radiographs obtained and interpreted today demonstrating no  acute findings.  No fractures, no subluxations.  PATIENT SURVEYS:  ABC scale 320/16 = 20% low level of physical functioning  COGNITION:  Overall cognitive status: Within functional limits for tasks assessed     SENSATION: WFL  MUSCLE LENGTH: NT  POSTURE: rounded shoulders and forward head  PALPATION: NA  LOWER EXTREMITY ROM: Not tested today due to time.   Active ROM Right eval Left eval  Hip flexion    Hip extension    Hip abduction    Hip adduction    Hip internal rotation    Hip external rotation    Knee flexion    Knee extension    Ankle dorsiflexion    Ankle plantarflexion    Ankle inversion    Ankle eversion     (Blank rows = not tested)  LOWER EXTREMITY MMT:  MMT Right eval Left eval  Hip flexion 4 4  Hip extension 4 4+  Hip abduction 5 5  Hip adduction 5 5  Hip internal rotation    Hip external rotation    Knee flexion 5 5  Knee extension 5 5  Ankle dorsiflexion 5 5  Ankle plantarflexion 5 5  Ankle inversion    Ankle eversion     (Blank rows = not tested)  LOWER EXTREMITY SPECIAL TESTS:  NT  FUNCTIONAL TESTS:  5 times sit to stand: 12 seconds without UE assist Dynamic Gait Index: 9/24 MCTSIB: Condition 1: Avg of 3 trials: 30 sec, Condition 2: Avg of 3 trials: 30 sec, Condition 3: Avg of 3 trials: 30 sec, Condition 4: Avg of 3 trials: 3 sec, and Total Score: 93/120   GAIT: Distance walked: 150' Assistive device utilized: None Level of assistance: SBA Comments: very slow, wide BOS, decreased heel strike, shuffling gait.   Tends to look down at feet.  Gait speed 0.46 m/s.  Noted R ankle pronation during stance phase, improved after applied ankle brace.   TODAY'S TREATMENT: 11/15/2021 Self Care - see patient education.  Also demonstrated how to apply ankle brace on R.     PATIENT EDUCATION:  Education details: findings and POC.  Also discussed daughter's concern with driving.   Person educated: Patient Education method: Explanation Education comprehension: verbalized understanding   HOME EXERCISE PROGRAM: TBD  ASSESSMENT:  CLINICAL IMPRESSION: Patient is an 88 y.o. female who was seen today for physical therapy evaluation and treatment for balance following fall with injury on 6/40/23.  She reports history of bil feet problems, especially R foot and arrived with ankle brace.  After ankle brace and proper shoes applied, she reported feeling much more supported, and noted improved gait.  Recommended wearing brace during active hours for better support, does not need to wear at night.  She demonstrates significant gait deviations even with brace, frequently reaching for support.  We did discuss use of cane, but she is against it at this time.  She demonstrates high risk for falls due to gait deviations, DGI of 9/24, and gait speed <0.55 m/s.  She would benefit from skilled physical therapy to improve LE strength, endurance, gait and balance in order to reduce risk of falls with injury.  Her R ankle/foot was not assessed today due to time constraints and no complaint of pain currently, but may need to be assessed in the future.  Due to patient preference, patient is being transferred to our [place] location.    OBJECTIVE IMPAIRMENTS Abnormal gait, decreased activity tolerance, decreased balance, decreased endurance, decreased mobility, difficulty walking, decreased ROM, decreased strength, and decreased safety awareness.   ACTIVITY LIMITATIONS carrying, lifting, standing, squatting, stairs, transfers, and  locomotion level  PARTICIPATION LIMITATIONS: cleaning, laundry, driving, shopping, community activity, and yard work  PERSONAL FACTORS Age and 1-2 comorbidities:  osteoporosis, CAD, foot problems  are also affecting patient's functional outcome.   REHAB POTENTIAL: Good  CLINICAL DECISION MAKING: Evolving/moderate complexity  EVALUATION COMPLEXITY: Moderate  GOALS: Goals reviewed with patient? Yes  SHORT TERM GOALS: Target date: 12/06/2021   Patient will be independent with initial HEP. Baseline: no HEP Goal status: INITIAL  2.  Patient will demonstrate decreased fall risk by scoring < 25 sec on TUG. Baseline: NT Goal status: INITIAL  3.  Patient will be educated on strategies to decrease risk of falls.  Baseline:  Goal status: INITIAL   LONG TERM GOALS: Target date: 01/10/2022   Patient will be independent with advanced/ongoing HEP to improve outcomes and carryover.  Baseline:  Goal status: INITIAL  2.  Patient will be able to ambulate 600' with LRAD with good safety to access community.  Baseline: fatigues after 300' Goal status: INITIAL  3.  Patient will be able to step up/down curb safely with LRAD for safety with community ambulation.  Baseline: unable Goal status: INITIAL   4.  Patient will demonstrate improved functional LE strength as demonstrated by 5/5 LE strength. Baseline: see objective Goal status: INITIAL  5.  Patient will demonstrate at least 19/24 on DGI to improve gait stability and reduce risk for falls. Baseline: 9/24  Goal status: INITIAL  6.  Patient will score >49/56 on Berg Balance test to demonstrate safety in ambulation without device .  Baseline: NT Goal status: INITIAL  7.  Patient will report >40% on ABC scale to demonstrate improved functional ability. Baseline: 20% = low level physical functioning Goal status: INITIAL  8.  Patient will demonstrate gait speed of >/= 1.8 ft/sec (0.55 m/s) to be a safe limited community ambulator  with decreased risk for recurrent falls.  Baseline: 0.46 m/s Goal status: INITIAL    PLAN: PT FREQUENCY: 2x/week  PT DURATION: 8 weeks  PLANNED INTERVENTIONS: Therapeutic exercises, Therapeutic activity, Neuromuscular re-education, Balance training, Gait training, Patient/Family education, Self Care, Joint mobilization, Stair training, Cryotherapy, Moist heat, Manual therapy, and Re-evaluation  PLAN FOR NEXT SESSION: BERG, education on fall prevention, initial HEP.  May benefit from [facility] program.    Jena Gauss, PT, DPT  11/15/2021, 6:37 PM

## 2021-11-15 ENCOUNTER — Ambulatory Visit: Payer: Medicare Other | Attending: Physician Assistant | Admitting: Physical Therapy

## 2021-11-15 ENCOUNTER — Encounter: Payer: Self-pay | Admitting: Physical Therapy

## 2021-11-15 DIAGNOSIS — M6281 Muscle weakness (generalized): Secondary | ICD-10-CM

## 2021-11-15 DIAGNOSIS — R2681 Unsteadiness on feet: Secondary | ICD-10-CM | POA: Diagnosis present

## 2021-11-15 DIAGNOSIS — R262 Difficulty in walking, not elsewhere classified: Secondary | ICD-10-CM | POA: Diagnosis present

## 2021-11-15 DIAGNOSIS — R296 Repeated falls: Secondary | ICD-10-CM

## 2021-11-20 ENCOUNTER — Ambulatory Visit: Payer: Medicare Other

## 2021-11-22 ENCOUNTER — Encounter: Admitting: Physical Therapy

## 2021-11-23 ENCOUNTER — Ambulatory Visit: Payer: Medicare Other | Admitting: Physical Therapy

## 2021-11-23 ENCOUNTER — Encounter: Payer: Self-pay | Admitting: Physical Therapy

## 2021-11-23 ENCOUNTER — Telehealth: Payer: Self-pay | Admitting: Physical Therapy

## 2021-11-23 DIAGNOSIS — R2681 Unsteadiness on feet: Secondary | ICD-10-CM | POA: Diagnosis not present

## 2021-11-23 DIAGNOSIS — R296 Repeated falls: Secondary | ICD-10-CM

## 2021-11-23 DIAGNOSIS — R262 Difficulty in walking, not elsewhere classified: Secondary | ICD-10-CM

## 2021-11-23 DIAGNOSIS — M6281 Muscle weakness (generalized): Secondary | ICD-10-CM

## 2021-11-23 NOTE — Therapy (Signed)
OUTPATIENT PHYSICAL THERAPY LOWER EXTREMITY EVALUATION   Patient Name: Toni Castaneda MRN: [id number] DOB:1933-08-31, 88 y.o., female Today's Date: 11/23/2021   PT End of Session - 11/23/21 1621     Visit Number 2    Number of Visits 16    Date for PT Re-Evaluation 01/10/22    Authorization Type [employer]    Progress Note Due on Visit 10    PT Start Time 1621    PT Stop Time 1700    PT Time Calculation (min) 39 min    Activity Tolerance Patient tolerated treatment well    Behavior During Therapy WFL for tasks assessed/performed             Past Medical History:  Diagnosis Date   Anxiety    High cholesterol    Hypertension    MI (myocardial infarction) (HCC)    Past Surgical History:  Procedure Laterality Date   ABDOMINAL HYSTERECTOMY     Cardiac stents     Patient Active Problem List   Diagnosis Date Noted   Left ankle injury 09/12/2012    PCP: Malka So., MD  REFERRING PROVIDER: Bailey Mech, PA-C  REFERRING DIAG: [phone] (ICD-10-CM) - At risk for falling  THERAPY DIAG:  Unsteadiness on feet  Repeated falls  Difficulty in walking, not elsewhere classified  Muscle weakness (generalized)  Rationale for Evaluation and Treatment Rehabilitation  ONSET DATE: 10/13/2021  SUBJECTIVE:   SUBJECTIVE STATEMENT: Pt states she's tired this afternoon. Has been standing cooking all day. No falls since last visit. Pt is currently in ankle boot -- not sure how long she needs to be in it.   PERTINENT HISTORY: PMH: history MI, HTN, high cholesterol.  History of bil foot problems.    PAIN:  Are you having pain? No  PRECAUTIONS: Fall  WEIGHT BEARING RESTRICTIONS No  FALLS:  Has patient fallen in last 6 months? Yes. Number of falls 1 (family   LIVING ENVIRONMENT: Lives with: lives with their family Lives in: House/apartment Stairs: No (2 steps to get into sunroom) Has following equipment at home: Single point cane and Environmental consultant - 2  wheeled  OCCUPATION: retired, Audiological scientist estate  PLOF: Independent  PATIENT GOALS "I just want to be able to walk like I used to."   OBJECTIVE:   DIAGNOSTIC FINDINGS: XR foot 10/25/2021 Right foot radiographs obtained and interpreted today demonstrating no  acute findings.  No fractures, no subluxations.  PATIENT SURVEYS:  ABC scale 320/16 = 20% low level of physical functioning  LOWER EXTREMITY MMT:  MMT Right eval Left eval  Hip flexion 4 4  Hip extension 4 4+  Hip abduction 5 5  Hip adduction 5 5  Hip internal rotation    Hip external rotation    Knee flexion 5 5  Knee extension 5 5  Ankle dorsiflexion 5 5  Ankle plantarflexion 5 5  Ankle inversion    Ankle eversion     (Blank rows = not tested)  LOWER EXTREMITY SPECIAL TESTS:  NT  FUNCTIONAL TESTS:  5 times sit to stand: 12 seconds without UE assist Dynamic Gait Index: 9/24 MCTSIB: Condition 1: Avg of 3 trials: 30 sec, Condition 2: Avg of 3 trials: 30 sec, Condition 3: Avg of 3 trials: 30 sec, Condition 4: Avg of 3 trials: 3 sec, and Total Score: 93/120    [facility] PT Assessment - 11/23/21 0001       Berg Balance Test   Sit to Stand Able  to stand without using hands and stabilize independently    Standing Unsupported Able to stand safely 2 minutes    Sitting with Back Unsupported but Feet Supported on Floor or Stool Able to sit safely and securely 2 minutes    Stand to Sit Sits safely with minimal use of hands    Transfers Able to transfer safely, minor use of hands    Standing Unsupported with Eyes Closed Able to stand 10 seconds safely    Standing Unsupported with Feet Together Able to place feet together independently and stand 1 minute safely    From Standing, Reach Forward with Outstretched Arm Can reach forward >12 cm safely (5")    From Standing Position, Pick up Object from Floor Able to pick up shoe, needs supervision    From Standing Position, Turn to Look Behind Over each Shoulder Looks behind from both  sides and weight shifts well    Turn 360 Degrees Able to turn 360 degrees safely but slowly    Standing Unsupported, Alternately Place Feet on Step/Stool Able to complete >2 steps/needs minimal assist    Standing Unsupported, One Foot in Front Able to take small step independently and hold 30 seconds    Standing on One Leg Tries to lift leg/unable to hold 3 seconds but remains standing independently    Total Score 44    Berg comment: <45 indicates high fal risk              TODAY'S TREATMENT:  11/23/2021   Nustep L5 x 5 min for warm up   Standing    Heel raise 2x10 with counter support as needed    Toe raises 2x10 with counter support     Hip abd 2x10 with counter support    Hip ext 2x10 with counter support    Marching 2x10 with intermittent counter support   Gait training  Pre gait: heel strike with knee ext x10 and manual assist with counter support    4x15' Next to counter for safety with CGA. Cues for heel strike with knee extension coming into toe off          11/15/2021 Self Care - see patient education.  Also demonstrated how to apply ankle brace on R.     PATIENT EDUCATION:  Education details: findings and POC.  Also discussed daughter's concern with driving.   Person educated: Patient Education method: Explanation Education comprehension: verbalized understanding   HOME EXERCISE PROGRAM: Access Code: [email or web address] URL: [email or web address] Date: 11/23/2021 Prepared by: Vernon Prey April Kirstie Peri  Exercises - Heel Raises with Counter Support  - 1 x daily - 7 x weekly - 2 sets - 10 reps - Toe Raises with Counter Support  - 1 x daily - 7 x weekly - 2 sets - 10 reps - Standing Marching  - 1 x daily - 7 x weekly - 2 sets - 10 reps - Standing Hip Abduction with Counter Support  - 1 x daily - 7 x weekly - 2 sets - 10 reps - Standing Hip Extension with Counter Support  - 1 x daily - 7 x weekly - 2 sets - 10 reps  ASSESSMENT:  CLINICAL  IMPRESSION: Treatment session focused on establishing initial HEP for hip and ankle strengthening. Performed Berg Balance Test - pt scored 44/56 indicating high fall risk. Initiated gait training to work on improving dynamic balance.    OBJECTIVE IMPAIRMENTS Abnormal gait, decreased activity tolerance, decreased balance, decreased endurance, decreased mobility, difficulty walking, decreased  ROM, decreased strength, and decreased safety awareness.   ACTIVITY LIMITATIONS carrying, lifting, standing, squatting, stairs, transfers, and locomotion level  PARTICIPATION LIMITATIONS: cleaning, laundry, driving, shopping, community activity, and yard work  PERSONAL FACTORS Age and 1-2 comorbidities:  osteoporosis, CAD, foot problems  are also affecting patient's functional outcome.   REHAB POTENTIAL: Good  CLINICAL DECISION MAKING: Evolving/moderate complexity  EVALUATION COMPLEXITY: Moderate  GOALS: Goals reviewed with patient? Yes  SHORT TERM GOALS: Target date: 12/06/2021   Patient will be independent with initial HEP. Baseline: no HEP Goal status: INITIAL  2.  Patient will demonstrate decreased fall risk by scoring < 25 sec on TUG. Baseline: NT Goal status: INITIAL  3.  Patient will be educated on strategies to decrease risk of falls.  Baseline:  Goal status: INITIAL   LONG TERM GOALS: Target date: 01/10/2022   Patient will be independent with advanced/ongoing HEP to improve outcomes and carryover.  Baseline:  Goal status: INITIAL  2.  Patient will be able to ambulate 600' with LRAD with good safety to access community.  Baseline: fatigues after 300' Goal status: INITIAL  3.  Patient will be able to step up/down curb safely with LRAD for safety with community ambulation.  Baseline: unable Goal status: INITIAL   4.  Patient will demonstrate improved functional LE strength as demonstrated by 5/5 LE strength. Baseline: see objective Goal status: INITIAL  5.  Patient will  demonstrate at least 19/24 on DGI to improve gait stability and reduce risk for falls. Baseline: 9/24  Goal status: INITIAL  6.  Patient will score >49/56 on Berg Balance test to demonstrate safety in ambulation without device .  Baseline: NT Goal status: INITIAL  7.  Patient will report >40% on ABC scale to demonstrate improved functional ability. Baseline: 20% = low level physical functioning Goal status: INITIAL  8.  Patient will demonstrate gait speed of >/= 1.8 ft/sec (0.55 m/s) to be a safe limited community ambulator with decreased risk for recurrent falls.  Baseline: 0.46 m/s Goal status: INITIAL    PLAN: PT FREQUENCY: 2x/week  PT DURATION: 8 weeks  PLANNED INTERVENTIONS: Therapeutic exercises, Therapeutic activity, Neuromuscular re-education, Balance training, Gait training, Patient/Family education, Self Care, Joint mobilization, Stair training, Cryotherapy, Moist heat, Manual therapy, and Re-evaluation  PLAN FOR NEXT SESSION: Progress HEP.  May benefit from [facility]    [facility] April Ma L Silas, , DPT  11/23/2021, 4:23 PM

## 2021-11-23 NOTE — Telephone Encounter (Signed)
Created telephone encounter in error

## 2021-11-27 ENCOUNTER — Encounter

## 2021-11-30 ENCOUNTER — Encounter: Admitting: Physical Therapy

## 2021-12-01 ENCOUNTER — Ambulatory Visit: Payer: Medicare Other | Admitting: Physical Therapy

## 2021-12-01 ENCOUNTER — Encounter: Payer: Self-pay | Admitting: Physical Therapy

## 2021-12-01 DIAGNOSIS — R262 Difficulty in walking, not elsewhere classified: Secondary | ICD-10-CM

## 2021-12-01 DIAGNOSIS — R296 Repeated falls: Secondary | ICD-10-CM

## 2021-12-01 DIAGNOSIS — R2681 Unsteadiness on feet: Secondary | ICD-10-CM | POA: Diagnosis not present

## 2021-12-01 DIAGNOSIS — M6281 Muscle weakness (generalized): Secondary | ICD-10-CM

## 2021-12-01 NOTE — Therapy (Signed)
OUTPATIENT PHYSICAL THERAPY LOWER EXTREMITY EVALUATION   Patient Name: Toni Castaneda MRN: [id number] DOB:01-Dec-1933, 88 y.o., female Today's Date: 12/01/2021   PT End of Session - 12/01/21 1108     Visit Number 3    Number of Visits 16    Date for PT Re-Evaluation 01/10/22    Authorization Type [employer]    Progress Note Due on Visit 10    PT Start Time 1105    PT Stop Time 1145    PT Time Calculation (min) 40 min    Activity Tolerance Patient tolerated treatment well    Behavior During Therapy WFL for tasks assessed/performed             Past Medical History:  Diagnosis Date   Anxiety    High cholesterol    Hypertension    MI (myocardial infarction) (HCC)    Past Surgical History:  Procedure Laterality Date   ABDOMINAL HYSTERECTOMY     Cardiac stents     Patient Active Problem List   Diagnosis Date Noted   Left ankle injury 09/12/2012    PCP: Malka So., MD  REFERRING PROVIDER: Bailey Mech, PA-C  REFERRING DIAG: [phone] (ICD-10-CM) - At risk for falling  THERAPY DIAG:  Unsteadiness on feet  Repeated falls  Difficulty in walking, not elsewhere classified  Muscle weakness (generalized)  Rationale for Evaluation and Treatment Rehabilitation  ONSET DATE: 10/13/2021  SUBJECTIVE:   SUBJECTIVE STATEMENT: Pt reports nothing new or different. Has been doing all of her exercises at home. No problems with them.   PERTINENT HISTORY: PMH: history MI, HTN, high cholesterol.  History of bil foot problems.    PAIN:  Are you having pain? No  PRECAUTIONS: Fall  WEIGHT BEARING RESTRICTIONS No  FALLS:  Has patient fallen in last 6 months? Yes. Number of falls 1 (family   LIVING ENVIRONMENT: Lives with: lives with their family Lives in: House/apartment Stairs: No (2 steps to get into sunroom) Has following equipment at home: Single point cane and Environmental consultant - 2 wheeled  OCCUPATION: retired, Audiological scientist estate  PLOF:  Independent  PATIENT GOALS "I just want to be able to walk like I used to."   OBJECTIVE:   (FROM EVAL) DIAGNOSTIC FINDINGS: XR foot 10/25/2021 Right foot radiographs obtained and interpreted today demonstrating no  acute findings.  No fractures, no subluxations.  PATIENT SURVEYS:  ABC scale 320/16 = 20% low level of physical functioning  LOWER EXTREMITY MMT:  MMT Right eval Left eval  Hip flexion 4 4  Hip extension 4 4+  Hip abduction 5 5  Hip adduction 5 5  Hip internal rotation    Hip external rotation    Knee flexion 5 5  Knee extension 5 5  Ankle dorsiflexion 5 5  Ankle plantarflexion 5 5  Ankle inversion    Ankle eversion     (Blank rows = not tested)  LOWER EXTREMITY SPECIAL TESTS:  NT  FUNCTIONAL TESTS:  5 times sit to stand: 12 seconds without UE assist Dynamic Gait Index: 9/24 MCTSIB: Condition 1: Avg of 3 trials: 30 sec, Condition 2: Avg of 3 trials: 30 sec, Condition 3: Avg of 3 trials: 30 sec, Condition 4: Avg of 3 trials: 3 sec, and Total Score: 93/120     ____________________________________________________  TODAY'S TREATMENT:  12/01/2021   Nustep L5 x 5 min for warm up   Standing    Heel/toe raises 2x10 with counter support    Heel raise  5x3 sec hold with counter support     Hip abd red tband 2x10 with counter support     Hip ext red tband 2x10 with counter support     Marching 2x10 with intermittent support   On airex:    Feet together, EO, head turns 2x10, head nods 2x10       11/23/2021   Nustep L5 x 5 min for warm up   Standing    Heel raise 2x10 with counter support as needed    Toe raises 2x10 with counter support     Hip abd 2x10 with counter support    Hip ext 2x10 with counter support    Marching 2x10 with intermittent counter support   Gait training  Pre gait: heel strike with knee ext x10 and manual assist with counter support    4x15' Next to counter for safety with CGA. Cues for heel strike with knee extension coming into toe  off   PATIENT EDUCATION:  Education details: HEP updates. Discussed safety with balance exercises.  Person educated: Patient Education method: Explanation Education comprehension: verbalized understanding   HOME EXERCISE PROGRAM: Access Code: [email or web address] URL: [email or web address] Date: 12/01/2021 Prepared by: Vernon Prey April Kirstie Peri  Exercises - Heel Raises with Counter Support  - 1 x daily - 7 x weekly - 2 sets - 10 reps - Toe Raises with Counter Support  - 1 x daily - 7 x weekly - 2 sets - 10 reps - Standing Marching  - 1 x daily - 7 x weekly - 2 sets - 10 reps - Hip Abduction with Resistance Loop  - 1 x daily - 7 x weekly - 2 sets - 10 reps - Hip Extension with Resistance Loop  - 1 x daily - 7 x weekly - 2 sets - 10 reps - Romberg Stance with Head Nods on Foam Pad  - 1 x daily - 7 x weekly - 2 sets - 10 reps - Romberg Stance on Foam Pad with Head Rotation  - 1 x daily - 7 x weekly - 2 sets - 10 reps  ASSESSMENT:  CLINICAL IMPRESSION: Continued to work on bilat LE strengthening. Initiated exercises on foam. Pt is demonstrating increasing LE strength.    OBJECTIVE IMPAIRMENTS Abnormal gait, decreased activity tolerance, decreased balance, decreased endurance, decreased mobility, difficulty walking, decreased ROM, decreased strength, and decreased safety awareness.   ACTIVITY LIMITATIONS carrying, lifting, standing, squatting, stairs, transfers, and locomotion level  PARTICIPATION LIMITATIONS: cleaning, laundry, driving, shopping, community activity, and yard work  PERSONAL FACTORS Age and 1-2 comorbidities:  osteoporosis, CAD, foot problems  are also affecting patient's functional outcome.   REHAB POTENTIAL: Good  CLINICAL DECISION MAKING: Evolving/moderate complexity  EVALUATION COMPLEXITY: Moderate  GOALS: Goals reviewed with patient? Yes  SHORT TERM GOALS: Target date: 12/06/2021   Patient will be independent with initial HEP. Baseline: no HEP Goal  status: INITIAL  2.  Patient will demonstrate decreased fall risk by scoring < 25 sec on TUG. Baseline: NT Goal status: INITIAL  3.  Patient will be educated on strategies to decrease risk of falls.  Baseline:  Goal status: INITIAL   LONG TERM GOALS: Target date: 01/10/2022   Patient will be independent with advanced/ongoing HEP to improve outcomes and carryover.  Baseline:  Goal status: INITIAL  2.  Patient will be able to ambulate 600' with LRAD with good safety to access community.  Baseline: fatigues after 300' Goal status: INITIAL  3.  Patient will be able to  step up/down curb safely with LRAD for safety with community ambulation.  Baseline: unable Goal status: INITIAL   4.  Patient will demonstrate improved functional LE strength as demonstrated by 5/5 LE strength. Baseline: see objective Goal status: INITIAL  5.  Patient will demonstrate at least 19/24 on DGI to improve gait stability and reduce risk for falls. Baseline: 9/24  Goal status: INITIAL  6.  Patient will score >49/56 on Berg Balance test to demonstrate safety in ambulation without device .  Baseline: NT Goal status: INITIAL  7.  Patient will report >40% on ABC scale to demonstrate improved functional ability. Baseline: 20% = low level physical functioning Goal status: INITIAL  8.  Patient will demonstrate gait speed of >/= 1.8 ft/sec (0.55 m/s) to be a safe limited community ambulator with decreased risk for recurrent falls.  Baseline: 0.46 m/s Goal status: INITIAL    PLAN: PT FREQUENCY: 2x/week  PT DURATION: 8 weeks  PLANNED INTERVENTIONS: Therapeutic exercises, Therapeutic activity, Neuromuscular re-education, Balance training, Gait training, Patient/Family education, Self Care, Joint mobilization, Stair training, Cryotherapy, Moist heat, Manual therapy, and Re-evaluation  PLAN FOR NEXT SESSION: Progress HEP.  May benefit from [facility] program.    [facility] April Ma L Noma, [place], DPT  12/01/2021,  11:08 AM

## 2021-12-04 ENCOUNTER — Encounter: Payer: Self-pay | Admitting: Physical Therapy

## 2021-12-04 ENCOUNTER — Encounter

## 2021-12-04 ENCOUNTER — Ambulatory Visit: Payer: Medicare Other | Admitting: Physical Therapy

## 2021-12-04 DIAGNOSIS — R296 Repeated falls: Secondary | ICD-10-CM

## 2021-12-04 DIAGNOSIS — R262 Difficulty in walking, not elsewhere classified: Secondary | ICD-10-CM

## 2021-12-04 DIAGNOSIS — R2681 Unsteadiness on feet: Secondary | ICD-10-CM

## 2021-12-04 DIAGNOSIS — M6281 Muscle weakness (generalized): Secondary | ICD-10-CM

## 2021-12-04 NOTE — Therapy (Signed)
OUTPATIENT PHYSICAL THERAPY LOWER EXTREMITY EVALUATION   Patient Name: Toni Castaneda MRN: [id number] DOB:[birth year], 88 y.o., female Today's Date: 12/04/2021   PT End of Session - 12/04/21 1443     Visit Number 4    Number of Visits 16    Date for PT Re-Evaluation 01/10/22    Authorization Type [employer]    Progress Note Due on Visit 10    PT Start Time 0258    PT Stop Time 1525    PT Time Calculation (min) 40 min    Activity Tolerance Patient tolerated treatment well    Behavior During Therapy WFL for tasks assessed/performed             Past Medical History:  Diagnosis Date   Anxiety    High cholesterol    Hypertension    MI (myocardial infarction) ([place])    Past Surgical History:  Procedure Laterality Date   ABDOMINAL HYSTERECTOMY     Cardiac stents     Patient Active Problem List   Diagnosis Date Noted   Left ankle injury 09/12/2012    PCP: Lilian Coma., MD  REFERRING PROVIDER: Jonathon Bellows, PA-C  REFERRING DIAG: [phone] (ICD-10-CM) - At risk for falling  THERAPY DIAG:  Unsteadiness on feet  Repeated falls  Difficulty in walking, not elsewhere classified  Muscle weakness (generalized)  Rationale for Evaluation and Treatment Rehabilitation  ONSET DATE: 10/13/2021  SUBJECTIVE:   SUBJECTIVE STATEMENT: Pt states she did all of her exercises this weekend except without the band. Pt will be going to [place] on Thursday morning.   PERTINENT HISTORY: PMH: history MI, HTN, high cholesterol.  History of bil foot problems.    PAIN:  Are you having pain? No  PRECAUTIONS: Fall  WEIGHT BEARING RESTRICTIONS No  FALLS:  Has patient fallen in last 6 months? Yes. Number of falls 1 (family   LIVING ENVIRONMENT: Lives with: lives with their family Lives in: House/apartment Stairs: No (2 steps to get into sunroom) Has following equipment at home: Single point cane and Environmental consultant - 2 wheeled  OCCUPATION: retired, Scientist, research (life sciences)  estate  PLOF: Independent  PATIENT GOALS "I just want to be able to walk like I used to."   OBJECTIVE:   (FROM EVAL) DIAGNOSTIC FINDINGS: XR foot 10/25/2021 Right foot radiographs obtained and interpreted today demonstrating no  acute findings.  No fractures, no subluxations.  PATIENT SURVEYS:  ABC scale 320/16 = 20% low level of physical functioning  LOWER EXTREMITY MMT:  MMT Right eval Left eval  Hip flexion 4 4  Hip extension 4 4+  Hip abduction 5 5  Hip adduction 5 5  Hip internal rotation    Hip external rotation    Knee flexion 5 5  Knee extension 5 5  Ankle dorsiflexion 5 5  Ankle plantarflexion 5 5  Ankle inversion    Ankle eversion     (Blank rows = not tested)  LOWER EXTREMITY SPECIAL TESTS:  NT  FUNCTIONAL TESTS:  5 times sit to stand: 12 seconds without UE assist Dynamic Gait Index: 9/24 MCTSIB: Condition 1: Avg of 3 trials: 30 sec, Condition 2: Avg of 3 trials: 30 sec, Condition 3: Avg of 3 trials: 30 sec, Condition 4: Avg of 3 trials: 3 sec, and Total Score: 93/120     ____________________________________________________  TODAY'S TREATMENT:  12/04/2021   Nustep L5 x 5 min for warm up    Standing Knee bending 2x10 without UE support Heel raise 2x10  without UE support Tandem stance 2x20 sec Tandem walking 4x10' SLS with counter support 2x20 sec Hip abduction red tband 2x10 Hip ext red tband 2x10 Marching 2x10  Gait  Cues for narrower BOS, heel strike and increased step length bilat 4x30' no a/d   12/01/2021   Nustep L5 x 5 min for warm up   Standing    Heel/toe raises 2x10 with counter support    Heel raise 5x3 sec hold with counter support     Hip abd red tband 2x10 with counter support     Hip ext red tband 2x10 with counter support     Marching 2x10 with intermittent support   On airex:    Feet together, EO, head turns 2x10, head nods 2x10   PATIENT EDUCATION:  Education details: HEP updates. Discussed safety with balance  exercises.  Person educated: Patient Education method: Explanation Education comprehension: verbalized understanding   HOME EXERCISE PROGRAM: Access Code: [email or web address] URL: [email or web address] Date: 12/01/2021 Prepared by: Estill Bamberg April Barahona with Counter Support  - 1 x daily - 7 x weekly - 2 sets - 10 reps - Toe Raises with Counter Support  - 1 x daily - 7 x weekly - 2 sets - 10 reps - Standing Marching  - 1 x daily - 7 x weekly - 2 sets - 10 reps - Hip Abduction with Resistance Loop  - 1 x daily - 7 x weekly - 2 sets - 10 reps - Hip Extension with Resistance Loop  - 1 x daily - 7 x weekly - 2 sets - 10 reps - Romberg Stance with Head Nods on Foam Pad  - 1 x daily - 7 x weekly - 2 sets - 10 reps - Romberg Stance on Foam Pad with Head Rotation  - 1 x daily - 7 x weekly - 2 sets - 10 reps  ASSESSMENT:  CLINICAL IMPRESSION: Session continued to focus on strengthening and balance. Pt able to stand on L LE for 10 sec this session without counter support. Continued to work on improving gait.   OBJECTIVE IMPAIRMENTS Abnormal gait, decreased activity tolerance, decreased balance, decreased endurance, decreased mobility, difficulty walking, decreased ROM, decreased strength, and decreased safety awareness.   ACTIVITY LIMITATIONS carrying, lifting, standing, squatting, stairs, transfers, and locomotion level  PARTICIPATION LIMITATIONS: cleaning, laundry, driving, shopping, community activity, and yard work  PERSONAL FACTORS Age and 1-2 comorbidities:  osteoporosis, CAD, foot problems  are also affecting patient's functional outcome.   REHAB POTENTIAL: Good  CLINICAL DECISION MAKING: Evolving/moderate complexity  EVALUATION COMPLEXITY: Moderate  GOALS: Goals reviewed with patient? Yes  SHORT TERM GOALS: Target date: 12/06/2021   Patient will be independent with initial HEP. Baseline: no HEP Goal status: MET  2.  Patient will  demonstrate decreased fall risk by scoring < 25 sec on TUG. Baseline: NT Goal status: INITIAL  3.  Patient will be educated on strategies to decrease risk of falls.  Baseline:  Goal status: INITIAL   LONG TERM GOALS: Target date: 01/10/2022   Patient will be independent with advanced/ongoing HEP to improve outcomes and carryover.  Baseline:  Goal status: INITIAL  2.  Patient will be able to ambulate 600' with LRAD with good safety to access community.  Baseline: fatigues after 300' Goal status: INITIAL  3.  Patient will be able to step up/down curb safely with LRAD for safety with community ambulation.  Baseline: unable Goal status: INITIAL   4.  Patient will  demonstrate improved functional LE strength as demonstrated by 5/5 LE strength. Baseline: see objective Goal status: INITIAL  5.  Patient will demonstrate at least 19/24 on DGI to improve gait stability and reduce risk for falls. Baseline: 9/24  Goal status: INITIAL  6.  Patient will score >49/56 on Berg Balance test to demonstrate safety in ambulation without device .  Baseline: NT Goal status: INITIAL  7.  Patient will report >40% on ABC scale to demonstrate improved functional ability. Baseline: 20% = low level physical functioning Goal status: INITIAL  8.  Patient will demonstrate gait speed of >/= 1.8 ft/sec (0.55 m/s) to be a safe limited community ambulator with decreased risk for recurrent falls.  Baseline: 0.46 m/s Goal status: INITIAL    PLAN: PT FREQUENCY: 2x/week  PT DURATION: 8 weeks  PLANNED INTERVENTIONS: Therapeutic exercises, Therapeutic activity, Neuromuscular re-education, Balance training, Gait training, Patient/Family education, Self Care, Joint mobilization, Stair training, Cryotherapy, Moist heat, Manual therapy, and Re-evaluation  PLAN FOR NEXT SESSION: Progress HEP.  Continue OTAGO exercises, progress balance and gait as able.    Ermin Parisien April Ma L Staisha Winiarski, PT, DPT  12/04/2021, 2:43 PM

## 2021-12-06 ENCOUNTER — Encounter: Admitting: Physical Therapy

## 2021-12-07 ENCOUNTER — Encounter: Admitting: Physical Therapy

## 2021-12-11 ENCOUNTER — Encounter: Admitting: Physical Therapy

## 2021-12-11 ENCOUNTER — Ambulatory Visit: Payer: Medicare Other | Admitting: Rehabilitative and Restorative Service Providers"

## 2021-12-14 ENCOUNTER — Encounter: Payer: Self-pay | Admitting: Physical Therapy

## 2021-12-14 ENCOUNTER — Ambulatory Visit: Payer: Medicare Other | Admitting: Physical Therapy

## 2021-12-14 DIAGNOSIS — R296 Repeated falls: Secondary | ICD-10-CM

## 2021-12-14 DIAGNOSIS — M6281 Muscle weakness (generalized): Secondary | ICD-10-CM

## 2021-12-14 DIAGNOSIS — R262 Difficulty in walking, not elsewhere classified: Secondary | ICD-10-CM

## 2021-12-14 DIAGNOSIS — R2681 Unsteadiness on feet: Secondary | ICD-10-CM | POA: Diagnosis not present

## 2021-12-14 NOTE — Therapy (Signed)
OUTPATIENT PHYSICAL THERAPY LOWER EXTREMITY EVALUATION   Patient Name: Toni Castaneda MRN: [id number] DOB:[birth year], 88 y.o., female Today's Date: 12/14/2021   PT End of Session - 12/14/21 1450     Visit Number 5    Number of Visits 16    Date for PT Re-Evaluation 01/10/22    Authorization Type [employer]    Progress Note Due on Visit 10    PT Start Time 1450    PT Stop Time 1530    PT Time Calculation (min) 40 min    Activity Tolerance Patient tolerated treatment well    Behavior During Therapy WFL for tasks assessed/performed             Past Medical History:  Diagnosis Date   Anxiety    High cholesterol    Hypertension    MI (myocardial infarction) [facility])    Past Surgical History:  Procedure Laterality Date   ABDOMINAL HYSTERECTOMY     Cardiac stents     Patient Active Problem List   Diagnosis Date Noted   Left ankle injury 09/12/2012    PCP: Lilian Coma., MD  REFERRING PROVIDER: Jonathon Bellows, PA-C  REFERRING DIAG: [phone] (ICD-10-CM) - At risk for falling  THERAPY DIAG:  Unsteadiness on feet  Repeated falls  Difficulty in walking, not elsewhere classified  Muscle weakness (generalized)  Rationale for Evaluation and Treatment Rehabilitation  ONSET DATE: 10/13/2021  SUBJECTIVE:   SUBJECTIVE STATEMENT: Pt reports she feels tired after going to [place]. Pt states she was so tired on Monday. Pt has been doing a little bit of exercises at home.   PERTINENT HISTORY: PMH: history MI, HTN, high cholesterol.  History of bil foot problems.    PAIN:  Are you having pain? No  PRECAUTIONS: Fall  WEIGHT BEARING RESTRICTIONS No  FALLS:  Has patient fallen in last 6 months? Yes. Number of falls 1   LIVING ENVIRONMENT: Lives with: lives with their family Lives in: House/apartment Stairs: No (2 steps to get into sunroom) Has following equipment at home: Single point cane and Walker - 2 wheeled  OCCUPATION: retired, Scientist, research (life sciences)  estate  PLOF: Independent  PATIENT GOALS "I just want to be able to walk like I used to."   OBJECTIVE:   (FROM EVAL) DIAGNOSTIC FINDINGS: XR foot 10/25/2021 Right foot radiographs obtained and interpreted today demonstrating no  acute findings.  No fractures, no subluxations.  PATIENT SURVEYS:  ABC scale 320/16 = 20% low level of physical functioning  LOWER EXTREMITY MMT:  MMT Right eval Left eval  Hip flexion 4 4  Hip extension 4 4+  Hip abduction 5 5  Hip adduction 5 5  Hip internal rotation    Hip external rotation    Knee flexion 5 5  Knee extension 5 5  Ankle dorsiflexion 5 5  Ankle plantarflexion 5 5  Ankle inversion    Ankle eversion     (Blank rows = not tested)  LOWER EXTREMITY SPECIAL TESTS:  NT  FUNCTIONAL TESTS:  5 times sit to stand: 12 seconds without UE assist Dynamic Gait Index: 9/24 MCTSIB: Condition 1: Avg of 3 trials: 30 sec, Condition 2: Avg of 3 trials: 30 sec, Condition 3: Avg of 3 trials: 30 sec, Condition 4: Avg of 3 trials: 3 sec, and Total Score: 93/120     ____________________________________________________  TODAY'S TREATMENT:  12/14/2021   Bike L1 x 5 min for warm up      Standing Knee bending 2x10  with intermittent UE support Heel/toe raise 2x10 without UE support Tandem stance 2x20 sec R&L Marching 2x10 Forward tap 4" step 2x10 Forward step up x10 CGA Side tap 4" step 2x10  Gait  Walking around track with cues of "fast" or "slow" x 4 laps  Walking with head turns 2x20'   Walking with head nods 2x20'  Backwards walking 3x20'    12/04/2021   Nustep L5 x 5 min for warm up    Standing Knee bending 2x10 without UE support Heel raise 2x10 without UE support Tandem stance 2x20 sec Tandem walking 4x10' SLS with counter support 2x20 sec Hip abduction red tband 2x10 Hip ext red tband 2x10 Marching 2x10  Gait  Cues for narrower BOS, heel strike and increased step length bilat 4x30' no a/d   12/01/2021   Nustep L5 x 5  min for warm up   Standing    Heel/toe raises 2x10 with counter support    Heel raise 5x3 sec hold with counter support     Hip abd red tband 2x10 with counter support     Hip ext red tband 2x10 with counter support     Marching 2x10 with intermittent support   On airex:    Feet together, EO, head turns 2x10, head nods 2x10   PATIENT EDUCATION:  Education details: HEP updates. Discussed safety with balance exercises.  Person educated: Patient Education method: Explanation Education comprehension: verbalized understanding   HOME EXERCISE PROGRAM: Access Code: [email or web address] URL: [email or web address] Date: 12/01/2021 Prepared by: Estill Bamberg April [place] with Counter Support  - 1 x daily - 7 x weekly - 2 sets - 10 reps - Toe Raises with Counter Support  - 1 x daily - 7 x weekly - 2 sets - 10 reps - Standing Marching  - 1 x daily - 7 x weekly - 2 sets - 10 reps - Hip Abduction with Resistance Loop  - 1 x daily - 7 x weekly - 2 sets - 10 reps - Hip Extension with Resistance Loop  - 1 x daily - 7 x weekly - 2 sets - 10 reps - Romberg Stance with Head Nods on Foam Pad  - 1 x daily - 7 x weekly - 2 sets - 10 reps - Romberg Stance on Foam Pad with Head Rotation  - 1 x daily - 7 x weekly - 2 sets - 10 reps  ASSESSMENT:  CLINICAL IMPRESSION: Initiated dynamic balance exercises this session. Greatest difficulty with backwards walking. Pt is demonstrating improving strength -- now able to step up on 4" step without UE support. Will attempt to progress HEP next session (has not had as much time to practice HEP since she has been traveling).    OBJECTIVE IMPAIRMENTS Abnormal gait, decreased activity tolerance, decreased balance, decreased endurance, decreased mobility, difficulty walking, decreased ROM, decreased strength, and decreased safety awareness.   ACTIVITY LIMITATIONS carrying, lifting, standing, squatting, stairs, transfers, and locomotion  level  PARTICIPATION LIMITATIONS: cleaning, laundry, driving, shopping, community activity, and yard work  PERSONAL FACTORS Age and 1-2 comorbidities:  osteoporosis, CAD, foot problems  are also affecting patient's functional outcome.   REHAB POTENTIAL: Good  CLINICAL DECISION MAKING: Evolving/moderate complexity  EVALUATION COMPLEXITY: Moderate  GOALS: Goals reviewed with patient? Yes  SHORT TERM GOALS: Target date: 12/06/2021   Patient will be independent with initial HEP. Baseline: no HEP Goal status: MET  2.  Patient will demonstrate decreased fall risk by scoring < 25 sec  on TUG. Baseline: NT Goal status: INITIAL  3.  Patient will be educated on strategies to decrease risk of falls.  Baseline:  Goal status: INITIAL   LONG TERM GOALS: Target date: 01/10/2022   Patient will be independent with advanced/ongoing HEP to improve outcomes and carryover.  Baseline:  Goal status: INITIAL  2.  Patient will be able to ambulate 600' with LRAD with good safety to access community.  Baseline: fatigues after 300' Goal status: INITIAL  3.  Patient will be able to step up/down curb safely with LRAD for safety with community ambulation.  Baseline: unable Goal status: INITIAL   4.  Patient will demonstrate improved functional LE strength as demonstrated by 5/5 LE strength. Baseline: see objective Goal status: INITIAL  5.  Patient will demonstrate at least 19/24 on DGI to improve gait stability and reduce risk for falls. Baseline: 9/24  Goal status: INITIAL  6.  Patient will score >49/56 on Berg Balance test to demonstrate safety in ambulation without device .  Baseline: NT Goal status: INITIAL  7.  Patient will report >40% on ABC scale to demonstrate improved functional ability. Baseline: 20% = low level physical functioning Goal status: INITIAL  8.  Patient will demonstrate gait speed of >/= 1.8 ft/sec (0.55 m/s) to be a safe limited community ambulator with decreased  risk for recurrent falls.  Baseline: 0.46 m/s Goal status: INITIAL    PLAN: PT FREQUENCY: 2x/week  PT DURATION: 8 weeks  PLANNED INTERVENTIONS: Therapeutic exercises, Therapeutic activity, Neuromuscular re-education, Balance training, Gait training, Patient/Family education, Self Care, Joint mobilization, Stair training, Cryotherapy, Moist heat, Manual therapy, and Re-evaluation  PLAN FOR NEXT SESSION: Progress HEP.  Continue OTAGO exercises, progress balance and gait as able.    Kiaan Overholser April Ma L Harmonie Verrastro, PT, DPT  12/14/2021, 2:50 PM

## 2021-12-19 ENCOUNTER — Encounter: Payer: Self-pay | Admitting: Physical Therapy

## 2021-12-19 ENCOUNTER — Ambulatory Visit: Payer: Medicare Other | Attending: Physician Assistant | Admitting: Physical Therapy

## 2021-12-19 DIAGNOSIS — M6281 Muscle weakness (generalized): Secondary | ICD-10-CM | POA: Insufficient documentation

## 2021-12-19 DIAGNOSIS — R2681 Unsteadiness on feet: Secondary | ICD-10-CM | POA: Insufficient documentation

## 2021-12-19 DIAGNOSIS — R262 Difficulty in walking, not elsewhere classified: Secondary | ICD-10-CM | POA: Insufficient documentation

## 2021-12-19 DIAGNOSIS — R296 Repeated falls: Secondary | ICD-10-CM | POA: Diagnosis present

## 2021-12-19 NOTE — Therapy (Signed)
OUTPATIENT PHYSICAL THERAPY LOWER EXTREMITY EVALUATION   Patient Name: Toni Castaneda MRN: [id number] DOB:1933-05-15, 88 y.o., female Today's Date: 12/19/2021   PT End of Session - 12/19/21 1059     Visit Number 6    Number of Visits 16    Date for PT Re-Evaluation 01/10/22    Authorization Type [employer]    Progress Note Due on Visit 10    PT Start Time 1100    PT Stop Time 1145    PT Time Calculation (min) 45 min    Activity Tolerance Patient tolerated treatment well    Behavior During Therapy WFL for tasks assessed/performed             Past Medical History:  Diagnosis Date   Anxiety    High cholesterol    Hypertension    MI (myocardial infarction) ([place])    Past Surgical History:  Procedure Laterality Date   ABDOMINAL HYSTERECTOMY     Cardiac stents     Patient Active Problem List   Diagnosis Date Noted   Left ankle injury 09/12/2012    PCP: Lilian Coma., MD  REFERRING PROVIDER: Jonathon Bellows, PA-C  REFERRING DIAG: [id number] (ICD-10-CM) - At risk for falling  THERAPY DIAG:  Unsteadiness on feet  Repeated falls  Difficulty in walking, not elsewhere classified  Muscle weakness (generalized)  Rationale for Evaluation and Treatment Rehabilitation  ONSET DATE: 10/13/2021  SUBJECTIVE:   SUBJECTIVE STATEMENT: Pt states she's been practicing stepping up on her step at home. Has been doing her exercises and reports some soreness.   PERTINENT HISTORY: PMH: history MI, HTN, high cholesterol.  History of bil foot problems.    PAIN:  Are you having pain? No  PRECAUTIONS: Fall  WEIGHT BEARING RESTRICTIONS No  FALLS:  Has patient fallen in last 6 months? Yes. Number of falls 1   LIVING ENVIRONMENT: Lives with: lives with their family Lives in: House/apartment Stairs: No (2 steps to get into sunroom) Has following equipment at home: Single point cane and Walker - 2 wheeled  OCCUPATION: retired, Scientist, research (life sciences) estate  PLOF:  Independent  PATIENT GOALS "I just want to be able to walk like I used to."   OBJECTIVE:   (FROM EVAL) DIAGNOSTIC FINDINGS: XR foot 10/25/2021 Right foot radiographs obtained and interpreted today demonstrating no  acute findings.  No fractures, no subluxations.  PATIENT SURVEYS:  ABC scale 320/16 = 20% low level of physical functioning  LOWER EXTREMITY MMT:  MMT Right eval Left eval  Hip flexion 4 4  Hip extension 4 4+  Hip abduction 5 5  Hip adduction 5 5  Hip internal rotation    Hip external rotation    Knee flexion 5 5  Knee extension 5 5  Ankle dorsiflexion 5 5  Ankle plantarflexion 5 5  Ankle inversion    Ankle eversion     (Blank rows = not tested)  LOWER EXTREMITY SPECIAL TESTS:  NT  FUNCTIONAL TESTS:  5 times sit to stand: 12 seconds without UE assist Dynamic Gait Index: 9/24 MCTSIB: Condition 1: Avg of 3 trials: 30 sec, Condition 2: Avg of 3 trials: 30 sec, Condition 3: Avg of 3 trials: 30 sec, Condition 4: Avg of 3 trials: 3 sec, and Total Score: 93/120     ____________________________________________________  TODAY'S TREATMENT:  12/19/2021   Bike L1 x 5 min for warm up      Standing    Gastoc stretch x30 sec R&L  Soleus stretch x30 sec R&L    Heel raise x10    Toe raise x10    Marching red TB 2x10    Side stepping red TB 2x10    Backwards walking red TB 2x10    Sitting    Piriformis stretch x30 sec R&L    Lateral hip shift into wall x30 sec R&L    On airex    EO Feet apart head turns x10    EO Feet apart head nods x10    EC Feet apart static standing 2x30 sec    On rockerboard    EO forward/back 2x10    EO static standing x30 sec    EO static standing with head nods x10    12/14/2021   Bike L1 x 5 min for warm up      Standing Knee bending 2x10 with intermittent UE support Heel/toe raise 2x10 without UE support Tandem stance 2x20 sec R&L Marching 2x10 Forward tap 4" step 2x10 Forward step up x10 CGA Side tap 4" step  2x10  Gait  Walking around track with cues of "fast" or "slow" x 4 laps  Walking with head turns 2x20'   Walking with head nods 2x20'  Backwards walking 3x20'    12/04/2021   Nustep L5 x 5 min for warm up    Standing Knee bending 2x10 without UE support Heel raise 2x10 without UE support Tandem stance 2x20 sec Tandem walking 4x10' SLS with counter support 2x20 sec Hip abduction red tband 2x10 Hip ext red tband 2x10 Marching 2x10  Gait  Cues for narrower BOS, heel strike and increased step length bilat 4x30' no a/d   12/01/2021   Nustep L5 x 5 min for warm up   Standing    Heel/toe raises 2x10 with counter support    Heel raise 5x3 sec hold with counter support     Hip abd red tband 2x10 with counter support     Hip ext red tband 2x10 with counter support     Marching 2x10 with intermittent support   On airex:    Feet together, EO, head turns 2x10, head nods 2x10   PATIENT EDUCATION:  Education details: HEP updates. Discussed safety with balance exercises.  Person educated: Patient Education method: Explanation Education comprehension: verbalized understanding   HOME EXERCISE PROGRAM: Access Code: [email or web address] URL: [email or web address] Date: 12/01/2021 Prepared by: Estill Bamberg April [place] with Counter Support  - 1 x daily - 7 x weekly - 2 sets - 10 reps - Toe Raises with Counter Support  - 1 x daily - 7 x weekly - 2 sets - 10 reps - Standing Marching  - 1 x daily - 7 x weekly - 2 sets - 10 reps - Hip Abduction with Resistance Loop  - 1 x daily - 7 x weekly - 2 sets - 10 reps - Hip Extension with Resistance Loop  - 1 x daily - 7 x weekly - 2 sets - 10 reps - Romberg Stance with Head Nods on Foam Pad  - 1 x daily - 7 x weekly - 2 sets - 10 reps - Romberg Stance on Foam Pad with Head Rotation  - 1 x daily - 7 x weekly - 2 sets - 10 reps  ASSESSMENT:  CLINICAL IMPRESSION: Session focused on progressing pt's HEP. Provided pt  stretches to help address her soreness. Pt tends to loose balance posteriorly. Worked on rockerboard to assist with maintaining forward weight  shift.    OBJECTIVE IMPAIRMENTS Abnormal gait, decreased activity tolerance, decreased balance, decreased endurance, decreased mobility, difficulty walking, decreased ROM, decreased strength, and decreased safety awareness.   ACTIVITY LIMITATIONS carrying, lifting, standing, squatting, stairs, transfers, and locomotion level  PARTICIPATION LIMITATIONS: cleaning, laundry, driving, shopping, community activity, and yard work  PERSONAL FACTORS Age and 1-2 comorbidities:  osteoporosis, CAD, foot problems  are also affecting patient's functional outcome.   REHAB POTENTIAL: Good  CLINICAL DECISION MAKING: Evolving/moderate complexity  EVALUATION COMPLEXITY: Moderate  GOALS: Goals reviewed with patient? Yes  SHORT TERM GOALS: Target date: 12/06/2021   Patient will be independent with initial HEP. Baseline: no HEP Goal status: MET  2.  Patient will demonstrate decreased fall risk by scoring < 25 sec on TUG. Baseline: NT Goal status: INITIAL  3.  Patient will be educated on strategies to decrease risk of falls.  Baseline:  Goal status: MET   LONG TERM GOALS: Target date: 01/10/2022   Patient will be independent with advanced/ongoing HEP to improve outcomes and carryover.  Baseline:  Goal status: IN PROGRESS  2.  Patient will be able to ambulate 600' with LRAD with good safety to access community.  Baseline: fatigues after 300' Goal status: IN PROGRESS  3.  Patient will be able to step up/down curb safely with LRAD for safety with community ambulation.  Baseline: unable Goal status: IN PROGRESS   4.  Patient will demonstrate improved functional LE strength as demonstrated by 5/5 LE strength. Baseline: see objective Goal status: IN PROGRESS  5.  Patient will demonstrate at least 19/24 on DGI to improve gait stability and reduce risk for  falls. Baseline: 9/24  Goal status: IN PROGRESS  6.  Patient will score >49/56 on Berg Balance test to demonstrate safety in ambulation without device .  Baseline: NT Goal status: IN PROGRESS  7.  Patient will report >40% on ABC scale to demonstrate improved functional ability. Baseline: 20% = low level physical functioning Goal status: IN PROGRESS  8.  Patient will demonstrate gait speed of >/= 1.8 ft/sec (0.55 m/s) to be a safe limited community ambulator with decreased risk for recurrent falls.  Baseline: 0.46 m/s Goal status: IN PROGRESS    PLAN: PT FREQUENCY: 2x/week  PT DURATION: 8 weeks  PLANNED INTERVENTIONS: Therapeutic exercises, Therapeutic activity, Neuromuscular re-education, Balance training, Gait training, Patient/Family education, Self Care, Joint mobilization, Stair training, Cryotherapy, Moist heat, Manual therapy, and Re-evaluation  PLAN FOR NEXT SESSION: Progress HEP.  Continue OTAGO exercises, progress balance and gait as able.    [place] April Ma L Aamiyah Derrick, PT, DPT  12/19/2021, 11:00 AM

## 2021-12-22 ENCOUNTER — Ambulatory Visit: Payer: Medicare Other | Admitting: Physical Therapy

## 2021-12-26 ENCOUNTER — Ambulatory Visit: Payer: Medicare Other | Admitting: Physical Therapy

## 2021-12-26 ENCOUNTER — Encounter: Payer: Self-pay | Admitting: Physical Therapy

## 2021-12-26 DIAGNOSIS — R2681 Unsteadiness on feet: Secondary | ICD-10-CM | POA: Diagnosis not present

## 2021-12-26 DIAGNOSIS — R296 Repeated falls: Secondary | ICD-10-CM

## 2021-12-26 DIAGNOSIS — M6281 Muscle weakness (generalized): Secondary | ICD-10-CM

## 2021-12-26 DIAGNOSIS — R262 Difficulty in walking, not elsewhere classified: Secondary | ICD-10-CM

## 2021-12-26 NOTE — Therapy (Signed)
OUTPATIENT PHYSICAL THERAPY TREATMENT   Patient Name: Toni Castaneda MRN: [id number] DOB:[birth year], 88 y.o., female Today's Date: 12/26/2021   PT End of Session - 12/26/21 1453     Visit Number 7    Number of Visits 16    Date for PT Re-Evaluation 01/10/22    Authorization Type [employer]    Progress Note Due on Visit 10    PT Start Time 6468    PT Stop Time 1530    PT Time Calculation (min) 37 min    Activity Tolerance Patient tolerated treatment well    Behavior During Therapy WFL for tasks assessed/performed             Past Medical History:  Diagnosis Date   Anxiety    High cholesterol    Hypertension    MI (myocardial infarction) ([place])    Past Surgical History:  Procedure Laterality Date   ABDOMINAL HYSTERECTOMY     Cardiac stents     Patient Active Problem List   Diagnosis Date Noted   Left ankle injury 09/12/2012    PCP: Lilian Coma., MD  REFERRING PROVIDER: Jonathon Bellows, PA-C  REFERRING DIAG: [id number] (ICD-10-CM) - At risk for falling  THERAPY DIAG:  Unsteadiness on feet  Repeated falls  Difficulty in walking, not elsewhere classified  Muscle weakness (generalized)  Rationale for Evaluation and Treatment Rehabilitation  ONSET DATE: 10/13/2021  SUBJECTIVE:   SUBJECTIVE STATEMENT: Pt states she's been busy this week.   PERTINENT HISTORY: PMH: history MI, HTN, high cholesterol.  History of bil foot problems.    PAIN:  Are you having pain? No  PRECAUTIONS: Fall  WEIGHT BEARING RESTRICTIONS No  FALLS:  Has patient fallen in last 6 months? Yes. Number of falls 1   LIVING ENVIRONMENT: Lives with: lives with their family Lives in: House/apartment Stairs: No (2 steps to get into sunroom) Has following equipment at home: Single point cane and Walker - 2 wheeled  OCCUPATION: retired, Scientist, research (life sciences) estate  PLOF: Independent  PATIENT GOALS "I just want to be able to walk like I used to."   OBJECTIVE:   (FROM  EVAL) DIAGNOSTIC FINDINGS: XR foot 10/25/2021 Right foot radiographs obtained and interpreted today demonstrating no  acute findings.  No fractures, no subluxations.  PATIENT SURVEYS:  ABC scale 320/16 = 20% low level of physical functioning  LOWER EXTREMITY MMT:  MMT Right eval Left eval  Hip flexion 4 4  Hip extension 4 4+  Hip abduction 5 5  Hip adduction 5 5  Hip internal rotation    Hip external rotation    Knee flexion 5 5  Knee extension 5 5  Ankle dorsiflexion 5 5  Ankle plantarflexion 5 5  Ankle inversion    Ankle eversion     (Blank rows = not tested)  LOWER EXTREMITY SPECIAL TESTS:  NT  FUNCTIONAL TESTS:  5 times sit to stand: 12 seconds without UE assist Dynamic Gait Index: 9/24 MCTSIB: Condition 1: Avg of 3 trials: 30 sec, Condition 2: Avg of 3 trials: 30 sec, Condition 3: Avg of 3 trials: 30 sec, Condition 4: Avg of 3 trials: 3 sec, and Total Score: 93/120     ____________________________________________________  TODAY'S TREATMENT:  12/26/21   Nu step L6 LEs/UEs for warm up      Standing    Side stepping red TB 2x10    Forwards walking red TB 2x10 Backwards walking2x10 6" step up/down x4 reps 4" step up/down x4  reps    On airex    EC Feet apart static standing 2x30 sec    Neuro re-ed    Forwards walking stepping over 3 pool noodles x 8 reps    Gait x 4 laps with intermittent "fast"    Gait x 4 laps with L<>R and up/down head        12/19/2021   Bike L1 x 5 min for warm up      Standing    Gastoc stretch x30 sec R&L    Soleus stretch x30 sec R&L    Heel raise x10    Toe raise x10    Marching red TB 2x10    Side stepping red TB 2x10    Backwards walking red TB 2x10    Sitting    Piriformis stretch x30 sec R&L    Lateral hip shift into wall x30 sec R&L    On airex    EO Feet apart head turns x10    EO Feet apart head nods x10    EC Feet apart static standing 2x30 sec    On rockerboard    EO forward/back 2x10    EO static  standing x30 sec    EO static standing with head nods x10    12/14/2021   Bike L1 x 5 min for warm up      Standing Knee bending 2x10 with intermittent UE support Heel/toe raise 2x10 without UE support Tandem stance 2x20 sec R&L Marching 2x10 Forward tap 4" step 2x10 Forward step up x10 CGA Side tap 4" step 2x10  Gait  Walking around track with cues of "fast" or "slow" x 4 laps  Walking with head turns 2x20'   Walking with head nods 2x20'  Backwards walking 3x20'    12/04/2021   Nustep L5 x 5 min for warm up    Standing Knee bending 2x10 without UE support Heel raise 2x10 without UE support Tandem stance 2x20 sec Tandem walking 4x10' SLS with counter support 2x20 sec Hip abduction red tband 2x10 Hip ext red tband 2x10 Marching 2x10  Gait  Cues for narrower BOS, heel strike and increased step length bilat 4x30' no a/d   12/01/2021   Nustep L5 x 5 min for warm up   Standing    Heel/toe raises 2x10 with counter support    Heel raise 5x3 sec hold with counter support     Hip abd red tband 2x10 with counter support     Hip ext red tband 2x10 with counter support     Marching 2x10 with intermittent support   On airex:    Feet together, EO, head turns 2x10, head nods 2x10   PATIENT EDUCATION:  Education details: HEP updates. Discussed safety with balance exercises.  Person educated: Patient Education method: Explanation Education comprehension: verbalized understanding   HOME EXERCISE PROGRAM: Access Code: [email or web address] URL: [email or web address] Date: 12/01/2021 Prepared by: Estill Bamberg April [place] with Counter Support  - 1 x daily - 7 x weekly - 2 sets - 10 reps - Toe Raises with Counter Support  - 1 x daily - 7 x weekly - 2 sets - 10 reps - Standing Marching  - 1 x daily - 7 x weekly - 2 sets - 10 reps - Hip Abduction with Resistance Loop  - 1 x daily - 7 x weekly - 2 sets - 10 reps - Hip Extension with Resistance Loop   - 1 x daily -  7 x weekly - 2 sets - 10 reps - Romberg Stance with Head Nods on Foam Pad  - 1 x daily - 7 x weekly - 2 sets - 10 reps - Romberg Stance on Foam Pad with Head Rotation  - 1 x daily - 7 x weekly - 2 sets - 10 reps  ASSESSMENT:  CLINICAL IMPRESSION: Session focused on progressing pt's HEP. Provided pt stretches to help address her soreness. Pt tends to loose balance posteriorly. Worked on rockerboard to assist with maintaining forward weight shift.    OBJECTIVE IMPAIRMENTS Abnormal gait, decreased activity tolerance, decreased balance, decreased endurance, decreased mobility, difficulty walking, decreased ROM, decreased strength, and decreased safety awareness.   ACTIVITY LIMITATIONS carrying, lifting, standing, squatting, stairs, transfers, and locomotion level  PARTICIPATION LIMITATIONS: cleaning, laundry, driving, shopping, community activity, and yard work  PERSONAL FACTORS Age and 1-2 comorbidities:  osteoporosis, CAD, foot problems  are also affecting patient's functional outcome.   REHAB POTENTIAL: Good  CLINICAL DECISION MAKING: Evolving/moderate complexity  EVALUATION COMPLEXITY: Moderate  GOALS: Goals reviewed with patient? Yes  SHORT TERM GOALS: Target date: 12/06/2021   Patient will be independent with initial HEP. Baseline: no HEP Goal status: MET  2.  Patient will demonstrate decreased fall risk by scoring < 25 sec on TUG. Baseline: NT Goal status: INITIAL  3.  Patient will be educated on strategies to decrease risk of falls.  Baseline:  Goal status: MET   LONG TERM GOALS: Target date: 01/10/2022   Patient will be independent with advanced/ongoing HEP to improve outcomes and carryover.  Baseline:  Goal status: IN PROGRESS  2.  Patient will be able to ambulate 600' with LRAD with good safety to access community.  Baseline: fatigues after 300' Goal status: IN PROGRESS  3.  Patient will be able to step up/down curb safely with LRAD for safety  with community ambulation.  Baseline: unable Goal status: IN PROGRESS   4.  Patient will demonstrate improved functional LE strength as demonstrated by 5/5 LE strength. Baseline: see objective Goal status: IN PROGRESS  5.  Patient will demonstrate at least 19/24 on DGI to improve gait stability and reduce risk for falls. Baseline: 9/24  Goal status: IN PROGRESS  6.  Patient will score >49/56 on Berg Balance test to demonstrate safety in ambulation without device .  Baseline: NT Goal status: IN PROGRESS  7.  Patient will report >40% on ABC scale to demonstrate improved functional ability. Baseline: 20% = low level physical functioning Goal status: IN PROGRESS  8.  Patient will demonstrate gait speed of >/= 1.8 ft/sec (0.55 m/s) to be a safe limited community ambulator with decreased risk for recurrent falls.  Baseline: 0.46 m/s Goal status: IN PROGRESS    PLAN: PT FREQUENCY: 2x/week  PT DURATION: 8 weeks  PLANNED INTERVENTIONS: Therapeutic exercises, Therapeutic activity, Neuromuscular re-education, Balance training, Gait training, Patient/Family education, Self Care, Joint mobilization, Stair training, Cryotherapy, Moist heat, Manual therapy, and Re-evaluation  PLAN FOR NEXT SESSION: Progress HEP.  Continue OTAGO exercises, progress balance and gait as able.    Janele Lague April Gordy Levan, PT, DPT  12/26/2021, 2:55 PM

## 2021-12-28 ENCOUNTER — Ambulatory Visit: Payer: Medicare Other | Admitting: Physical Therapy

## 2021-12-28 ENCOUNTER — Encounter: Payer: Self-pay | Admitting: Physical Therapy

## 2021-12-28 DIAGNOSIS — R2681 Unsteadiness on feet: Secondary | ICD-10-CM | POA: Diagnosis not present

## 2021-12-28 DIAGNOSIS — R262 Difficulty in walking, not elsewhere classified: Secondary | ICD-10-CM

## 2021-12-28 DIAGNOSIS — M6281 Muscle weakness (generalized): Secondary | ICD-10-CM

## 2021-12-28 DIAGNOSIS — R296 Repeated falls: Secondary | ICD-10-CM

## 2021-12-28 NOTE — Therapy (Signed)
OUTPATIENT PHYSICAL THERAPY TREATMENT   Patient Name: Toni Castaneda MRN: [id number] DOB:January 14, 1934, 88 y.o., female Today's Date: 12/28/2021   PT End of Session - 12/28/21 1618     Visit Number 8    Number of Visits 16    Date for PT Re-Evaluation 01/10/22    Authorization Type [employer]    Progress Note Due on Visit 10    PT Start Time 0093    PT Stop Time 1700    PT Time Calculation (min) 43 min    Activity Tolerance Patient tolerated treatment well    Behavior During Therapy WFL for tasks assessed/performed             Past Medical History:  Diagnosis Date   Anxiety    High cholesterol    Hypertension    MI (myocardial infarction) ([place])    Past Surgical History:  Procedure Laterality Date   ABDOMINAL HYSTERECTOMY     Cardiac stents     Patient Active Problem List   Diagnosis Date Noted   Left ankle injury 09/12/2012    PCP: Lilian Coma., MD  REFERRING PROVIDER: Jonathon Bellows, PA-C  REFERRING DIAG: [phone] (ICD-10-CM) - At risk for falling  THERAPY DIAG:  Unsteadiness on feet  Repeated falls  Difficulty in walking, not elsewhere classified  Muscle weakness (generalized)  Rationale for Evaluation and Treatment Rehabilitation  ONSET DATE: 10/13/2021  SUBJECTIVE:   SUBJECTIVE STATEMENT: Pt reports she found a good pillow to work on her balance but her daughter didn't want her standing on it.    PERTINENT HISTORY: PMH: history MI, HTN, high cholesterol.  History of bil foot problems.    PAIN:  Are you having pain? No  PRECAUTIONS: Fall  WEIGHT BEARING RESTRICTIONS No  FALLS:  Has patient fallen in last 6 months? Yes. Number of falls 1   LIVING ENVIRONMENT: Lives with: lives with their family Lives in: House/apartment Stairs: No (2 steps to get into sunroom) Has following equipment at home: Single point cane and Walker - 2 wheeled  OCCUPATION: retired, Scientist, research (life sciences) estate  PLOF: Independent  PATIENT GOALS "I just  want to be able to walk like I used to."   OBJECTIVE:   (FROM EVAL) DIAGNOSTIC FINDINGS: XR foot 10/25/2021 Right foot radiographs obtained and interpreted today demonstrating no  acute findings.  No fractures, no subluxations.  PATIENT SURVEYS:  ABC scale 320/16 = 20% low level of physical functioning  LOWER EXTREMITY MMT:  MMT Right eval Left eval  Hip flexion 4 4  Hip extension 4 4+  Hip abduction 5 5  Hip adduction 5 5  Hip internal rotation    Hip external rotation    Knee flexion 5 5  Knee extension 5 5  Ankle dorsiflexion 5 5  Ankle plantarflexion 5 5  Ankle inversion    Ankle eversion     (Blank rows = not tested)  LOWER EXTREMITY SPECIAL TESTS:  NT  FUNCTIONAL TESTS:  5 times sit to stand: 12 seconds without UE assist Dynamic Gait Index: 9/24 MCTSIB: Condition 1: Avg of 3 trials: 30 sec, Condition 2: Avg of 3 trials: 30 sec, Condition 3: Avg of 3 trials: 30 sec, Condition 4: Avg of 3 trials: 3 sec, and Total Score: 93/120     ____________________________________________________  TODAY'S TREATMENT: 12/28/21  Nu step L6 x 5 min LEs/UEs for warm up   On airex   EO feet apart head nods and head turns 2 x30 sec  EC feet apart head nods and head turns 2 x30 sec   EC feet apart static standing 2x30 sec   Marching 2x10    Heel raise 2x10  Standing   Step forward over/back over dowel 2x10 R & L   Four square stepping 5 CW & 5 CCW   Backwards walking      12/26/21   Nu step L6 LEs/UEs for warm up      Standing    Side stepping red TB 2x10    Forwards walking red TB 2x10 Backwards walking2x10 6" step up/down x4 reps 4" step up/down x4 reps    On airex    EC Feet apart static standing 2x30 sec    Neuro re-ed    Forwards walking stepping over 3 pool noodles x 8 reps    Gait x 4 laps with intermittent "fast"    Gait x 4 laps with L<>R and up/down head        12/19/2021   Bike L1 x 5 min for warm up      Standing    Gastoc stretch x30 sec  R&L    Soleus stretch x30 sec R&L    Heel raise x10    Toe raise x10    Marching red TB 2x10    Side stepping red TB 2x10    Backwards walking red TB 2x10    Sitting    Piriformis stretch x30 sec R&L    Lateral hip shift into wall x30 sec R&L    On airex    EO Feet apart head turns x10    EO Feet apart head nods x10    EC Feet apart static standing 2x30 sec    On rockerboard    EO forward/back 2x10    EO static standing x30 sec    EO static standing with head nods x10    12/14/2021   Bike L1 x 5 min for warm up      Standing Knee bending 2x10 with intermittent UE support Heel/toe raise 2x10 without UE support Tandem stance 2x20 sec R&L Marching 2x10 Forward tap 4" step 2x10 Forward step up x10 CGA Side tap 4" step 2x10  Gait  Walking around track with cues of "fast" or "slow" x 4 laps  Walking with head turns 2x20'   Walking with head nods 2x20'  Backwards walking 3x20'    PATIENT EDUCATION:  Education details: HEP updates. Discussed safety with balance exercises.  Person educated: Patient Education method: Explanation Education comprehension: verbalized understanding   HOME EXERCISE PROGRAM: Access Code: [email or web address] URL: [email or web address] Date: 12/28/2021 Prepared by: Estill Bamberg April [place] with Counter Support  - 1 x daily - 7 x weekly - 2 sets - 10 reps - Toe Raises with Counter Support  - 1 x daily - 7 x weekly - 2 sets - 10 reps - Standing Marching  - 1 x daily - 7 x weekly - 2 sets - 10 reps - Romberg Stance with Head Nods on Foam Pad  - 1 x daily - 7 x weekly - 2 sets - 10 reps - Romberg Stance on Foam Pad with Head Rotation  - 1 x daily - 7 x weekly - 2 sets - 10 reps - Side Stepping with Resistance at Ankles and Counter Support  - 1 x daily - 7 x weekly - 2 sets - 10 reps - Forward and Backward Monster Walk with Resistance at Ankles and Counter  Support  - 1 x daily - 7 x weekly - 2 sets - 10 reps -  Backwards Walking  - 1 x daily - 7 x weekly - 2 sets - 10 reps - Stepping in 4 Square Pattern  - 1 x daily - 7 x weekly - 2 sets - 10 reps  ASSESSMENT:  CLINICAL IMPRESSION: Session focused on static balance on foam and dynamic stepping. Good improvements with balance eyes closed on airex. Worked on increasing backwards stepping and stability for posterior weight shifts. Pt challenged with backwards stepping over dowel and four square stepping.    OBJECTIVE IMPAIRMENTS Abnormal gait, decreased activity tolerance, decreased balance, decreased endurance, decreased mobility, difficulty walking, decreased ROM, decreased strength, and decreased safety awareness.   ACTIVITY LIMITATIONS carrying, lifting, standing, squatting, stairs, transfers, and locomotion level  PARTICIPATION LIMITATIONS: cleaning, laundry, driving, shopping, community activity, and yard work  PERSONAL FACTORS Age and 1-2 comorbidities:  osteoporosis, CAD, foot problems  are also affecting patient's functional outcome.   REHAB POTENTIAL: Good  CLINICAL DECISION MAKING: Evolving/moderate complexity  EVALUATION COMPLEXITY: Moderate  GOALS: Goals reviewed with patient? Yes  SHORT TERM GOALS: Target date: 12/06/2021   Patient will be independent with initial HEP. Baseline: no HEP Goal status: MET  2.  Patient will demonstrate decreased fall risk by scoring < 25 sec on TUG. Baseline: NT Goal status: INITIAL  3.  Patient will be educated on strategies to decrease risk of falls.  Baseline:  Goal status: MET   LONG TERM GOALS: Target date: 01/10/2022   Patient will be independent with advanced/ongoing HEP to improve outcomes and carryover.  Baseline:  Goal status: IN PROGRESS  2.  Patient will be able to ambulate 600' with LRAD with good safety to access community.  Baseline: fatigues after 300' Goal status: IN PROGRESS  3.  Patient will be able to step up/down curb safely with LRAD for safety with community  ambulation.  Baseline: unable Goal status: IN PROGRESS   4.  Patient will demonstrate improved functional LE strength as demonstrated by 5/5 LE strength. Baseline: see objective Goal status: IN PROGRESS  5.  Patient will demonstrate at least 19/24 on DGI to improve gait stability and reduce risk for falls. Baseline: 9/24  Goal status: IN PROGRESS  6.  Patient will score >49/56 on Berg Balance test to demonstrate safety in ambulation without device .  Baseline: NT Goal status: IN PROGRESS  7.  Patient will report >40% on ABC scale to demonstrate improved functional ability. Baseline: 20% = low level physical functioning Goal status: IN PROGRESS  8.  Patient will demonstrate gait speed of >/= 1.8 ft/sec (0.55 m/s) to be a safe limited community ambulator with decreased risk for recurrent falls.  Baseline: 0.46 m/s Goal status: IN PROGRESS    PLAN: PT FREQUENCY: 2x/week  PT DURATION: 8 weeks  PLANNED INTERVENTIONS: Therapeutic exercises, Therapeutic activity, Neuromuscular re-education, Balance training, Gait training, Patient/Family education, Self Care, Joint mobilization, Stair training, Cryotherapy, Moist heat, Manual therapy, and Re-evaluation  PLAN FOR NEXT SESSION: Progress HEP.  Continue OTAGO exercises, progress balance and gait as able.    Miner Koral April Ma L Mykaela Arena, PT, DPT  12/28/2021, 4:19 PM

## 2022-01-02 ENCOUNTER — Ambulatory Visit: Payer: Medicare Other | Admitting: Physical Therapy

## 2022-01-02 ENCOUNTER — Encounter: Payer: Self-pay | Admitting: Physical Therapy

## 2022-01-02 DIAGNOSIS — R296 Repeated falls: Secondary | ICD-10-CM

## 2022-01-02 DIAGNOSIS — M6281 Muscle weakness (generalized): Secondary | ICD-10-CM

## 2022-01-02 DIAGNOSIS — R2681 Unsteadiness on feet: Secondary | ICD-10-CM | POA: Diagnosis not present

## 2022-01-02 DIAGNOSIS — R262 Difficulty in walking, not elsewhere classified: Secondary | ICD-10-CM

## 2022-01-02 NOTE — Therapy (Signed)
OUTPATIENT PHYSICAL THERAPY TREATMENT   Patient Name: ANAKA BEAZER MRN: [id number] DOB:07-Apr-1934, 88 y.o., female Today's Date: 01/02/2022   PT End of Session - 01/02/22 1459     Visit Number 9    Number of Visits 16    Date for PT Re-Evaluation 01/10/22    Authorization Type [employer]    Authorization - Visit Number 9    Progress Note Due on Visit 10    PT Start Time 1937    PT Stop Time 1530    PT Time Calculation (min) 37 min    Activity Tolerance Patient tolerated treatment well    Behavior During Therapy WFL for tasks assessed/performed             Past Medical History:  Diagnosis Date   Anxiety    High cholesterol    Hypertension    MI (myocardial infarction) ([place])    Past Surgical History:  Procedure Laterality Date   ABDOMINAL HYSTERECTOMY     Cardiac stents     Patient Active Problem List   Diagnosis Date Noted   Left ankle injury 09/12/2012    PCP: Lilian Coma., MD  REFERRING PROVIDER: Jonathon Bellows, PA-C  REFERRING DIAG: [phone] (ICD-10-CM) - At risk for falling  THERAPY DIAG:  Unsteadiness on feet  Repeated falls  Difficulty in walking, not elsewhere classified  Muscle weakness (generalized)  Rationale for Evaluation and Treatment Rehabilitation  ONSET DATE: 10/13/2021  SUBJECTIVE:   SUBJECTIVE STATEMENT: Pt states she got her balance foam in but has not been able to use it yet. Has been able to work on her other exercises at home.   PERTINENT HISTORY: PMH: history MI, HTN, high cholesterol.  History of bil foot problems.    PAIN:  Are you having pain? No  PRECAUTIONS: Fall  WEIGHT BEARING RESTRICTIONS No  FALLS:  Has patient fallen in last 6 months? Yes. Number of falls 1   LIVING ENVIRONMENT: Lives with: lives with their family Lives in: House/apartment Stairs: No (2 steps to get into sunroom) Has following equipment at home: Single point cane and Walker - 2 wheeled  OCCUPATION: retired,  Scientist, research (life sciences) estate  PLOF: Independent  PATIENT GOALS "I just want to be able to walk like I used to."   OBJECTIVE:   (FROM EVAL) DIAGNOSTIC FINDINGS: XR foot 10/25/2021 Right foot radiographs obtained and interpreted today demonstrating no  acute findings.  No fractures, no subluxations.  PATIENT SURVEYS:  ABC scale 320/16 = 20% low level of physical functioning  LOWER EXTREMITY MMT:  MMT Right eval Left eval  Hip flexion 4 4  Hip extension 4 4+  Hip abduction 5 5  Hip adduction 5 5  Hip internal rotation    Hip external rotation    Knee flexion 5 5  Knee extension 5 5  Ankle dorsiflexion 5 5  Ankle plantarflexion 5 5  Ankle inversion    Ankle eversion     (Blank rows = not tested)  LOWER EXTREMITY SPECIAL TESTS:  NT  FUNCTIONAL TESTS:  5 times sit to stand: 12 seconds without UE assist Dynamic Gait Index: 9/24 MCTSIB: Condition 1: Avg of 3 trials: 30 sec, Condition 2: Avg of 3 trials: 30 sec, Condition 3: Avg of 3 trials: 30 sec, Condition 4: Avg of 3 trials: 3 sec, and Total Score: 93/120     ____________________________________________________  TODAY'S TREATMENT: 01/02/22  Nustep L6 x 5 min Les/UEs   Standing  4" forward step  up/x10  6" forward step up x10  6" step up/down on platform x10  Backwards walking 4x20'   On airex   EC feet apart head nods and head turns 2 x30 sec   EC feet apart static standing 2x30 sec   EO partial tandem stance 2x30 sec    12/28/21  Nu step L6 x 5 min LEs/UEs for warm up   On airex   EO feet apart head nods and head turns 2 x30 sec   EC feet apart head nods and head turns 2 x30 sec   EC feet apart static standing 2x30 sec   Marching 2x10    Heel raise 2x10  Standing   Step forward over/back over dowel 2x10 R & L   Four square stepping 5 CW & 5 CCW   Backwards walking      12/26/21   Nu step L6 LEs/UEs for warm up      Standing    Side stepping red TB 2x10    Forwards walking red TB 2x10 Backwards walking2x10 6"  step up/down x4 reps 4" step up/down x4 reps    On airex    EC Feet apart static standing 2x30 sec    Neuro re-ed    Forwards walking stepping over 3 pool noodles x 8 reps    Gait x 4 laps with intermittent "fast"    Gait x 4 laps with L<>R and up/down head       PATIENT EDUCATION:  Education details: HEP updates. Discussed safety with balance exercises.  Person educated: Patient Education method: Explanation Education comprehension: verbalized understanding   HOME EXERCISE PROGRAM: Access Code: [email or web address] URL: [email or web address] Date: 12/28/2021 Prepared by: Estill Bamberg April [place] with Counter Support  - 1 x daily - 7 x weekly - 2 sets - 10 reps - Toe Raises with Counter Support  - 1 x daily - 7 x weekly - 2 sets - 10 reps - Standing Marching  - 1 x daily - 7 x weekly - 2 sets - 10 reps - Romberg Stance with Head Nods on Foam Pad  - 1 x daily - 7 x weekly - 2 sets - 10 reps - Romberg Stance on Foam Pad with Head Rotation  - 1 x daily - 7 x weekly - 2 sets - 10 reps - Side Stepping with Resistance at Ankles and Counter Support  - 1 x daily - 7 x weekly - 2 sets - 10 reps - Forward and Backward Monster Walk with Resistance at Ankles and Counter Support  - 1 x daily - 7 x weekly - 2 sets - 10 reps - Backwards Walking  - 1 x daily - 7 x weekly - 2 sets - 10 reps - Stepping in 4 Square Pattern  - 1 x daily - 7 x weekly - 2 sets - 10 reps  ASSESSMENT:  CLINICAL IMPRESSION: Pt with good compliance to her exercises. Continued to work on foam and dynamic gait. No LOB with eyes closed on foam demonstrating improvements with her mCTSIB.    OBJECTIVE IMPAIRMENTS Abnormal gait, decreased activity tolerance, decreased balance, decreased endurance, decreased mobility, difficulty walking, decreased ROM, decreased strength, and decreased safety awareness.   ACTIVITY LIMITATIONS carrying, lifting, standing, squatting, stairs, transfers, and  locomotion level  PARTICIPATION LIMITATIONS: cleaning, laundry, driving, shopping, community activity, and yard work  PERSONAL FACTORS Age and 1-2 comorbidities:  osteoporosis, CAD, foot problems  are also affecting patient's functional outcome.  REHAB POTENTIAL: Good  CLINICAL DECISION MAKING: Evolving/moderate complexity  EVALUATION COMPLEXITY: Moderate  GOALS: Goals reviewed with patient? Yes  SHORT TERM GOALS: Target date: 12/06/2021   Patient will be independent with initial HEP. Baseline: no HEP Goal status: MET  2.  Patient will demonstrate decreased fall risk by scoring < 25 sec on TUG. Baseline: NT Goal status: INITIAL  3.  Patient will be educated on strategies to decrease risk of falls.  Baseline:  Goal status: MET   LONG TERM GOALS: Target date: 01/10/2022   Patient will be independent with advanced/ongoing HEP to improve outcomes and carryover.  Baseline:  Goal status: IN PROGRESS  2.  Patient will be able to ambulate 600' with LRAD with good safety to access community.  Baseline: fatigues after 300' Goal status: IN PROGRESS  3.  Patient will be able to step up/down curb safely with LRAD for safety with community ambulation.  Baseline: unable Goal status: IN PROGRESS   4.  Patient will demonstrate improved functional LE strength as demonstrated by 5/5 LE strength. Baseline: see objective Goal status: IN PROGRESS  5.  Patient will demonstrate at least 19/24 on DGI to improve gait stability and reduce risk for falls. Baseline: 9/24  Goal status: IN PROGRESS  6.  Patient will score >49/56 on Berg Balance test to demonstrate safety in ambulation without device .  Baseline: NT Goal status: IN PROGRESS  7.  Patient will report >40% on ABC scale to demonstrate improved functional ability. Baseline: 20% = low level physical functioning Goal status: IN PROGRESS  8.  Patient will demonstrate gait speed of >/= 1.8 ft/sec (0.55 m/s) to be a safe limited  community ambulator with decreased risk for recurrent falls.  Baseline: 0.46 m/s Goal status: IN PROGRESS    PLAN: PT FREQUENCY: 2x/week  PT DURATION: 8 weeks  PLANNED INTERVENTIONS: Therapeutic exercises, Therapeutic activity, Neuromuscular re-education, Balance training, Gait training, Patient/Family education, Self Care, Joint mobilization, Stair training, Cryotherapy, Moist heat, Manual therapy, and Re-evaluation  PLAN FOR NEXT SESSION: Re-assess DGI and Berg. Work on curbs outdoors. Progress HEP.  Continue OTAGO exercises, progress balance and gait as able.    Larisha Vencill April Ma L Sophiagrace Benbrook, PT, DPT  01/02/2022, 3:00 PM

## 2022-01-04 ENCOUNTER — Encounter: Payer: Self-pay | Admitting: Physical Therapy

## 2022-01-04 ENCOUNTER — Ambulatory Visit: Payer: Medicare Other | Admitting: Physical Therapy

## 2022-01-04 DIAGNOSIS — R2681 Unsteadiness on feet: Secondary | ICD-10-CM

## 2022-01-04 DIAGNOSIS — R296 Repeated falls: Secondary | ICD-10-CM

## 2022-01-04 DIAGNOSIS — R262 Difficulty in walking, not elsewhere classified: Secondary | ICD-10-CM

## 2022-01-04 DIAGNOSIS — M6281 Muscle weakness (generalized): Secondary | ICD-10-CM

## 2022-01-04 NOTE — Therapy (Signed)
OUTPATIENT PHYSICAL THERAPY TREATMENT AND PROGRESS NOTE   Patient Name: Toni Castaneda MRN: [id number] DOB:[birth year], 88 y.o., female Today's Date: 01/04/2022  Progress Note Reporting Period 11/15/21 to 01/04/22  See note below for Objective Data and Assessment of Progress/Goals.       PT End of Session - 01/04/22 1448     Visit Number 10    Number of Visits 16    Date for PT Re-Evaluation 01/10/22    Authorization Type [place] [employer]    Authorization - Visit Number 10    Progress Note Due on Visit 10    PT Start Time 4481    PT Stop Time 1530    PT Time Calculation (min) 41 min    Activity Tolerance Patient tolerated treatment well    Behavior During Therapy WFL for tasks assessed/performed             Past Medical History:  Diagnosis Date   Anxiety    High cholesterol    Hypertension    MI (myocardial infarction) ([place])    Past Surgical History:  Procedure Laterality Date   ABDOMINAL HYSTERECTOMY     Cardiac stents     Patient Active Problem List   Diagnosis Date Noted   Left ankle injury 09/12/2012    PCP: Lilian Coma., MD  REFERRING PROVIDER: Jonathon Bellows, PA-C  REFERRING DIAG: [phone] (ICD-10-CM) - At risk for falling  THERAPY DIAG:  Unsteadiness on feet  Repeated falls  Difficulty in walking, not elsewhere classified  Muscle weakness (generalized)  Rationale for Evaluation and Treatment Rehabilitation  ONSET DATE: 10/13/2021  SUBJECTIVE:   SUBJECTIVE STATEMENT: Pt reports she was very tired the next day after therapy -- she was not able to do her exercises. Request to drop to once a week so she has more time to do .   PERTINENT HISTORY: PMH: history MI, HTN, high cholesterol.  History of bil foot problems.    PAIN:  Are you having pain? No  PRECAUTIONS: Fall  WEIGHT BEARING RESTRICTIONS No  FALLS:  Has patient fallen in last 6 months? Yes. Number of falls 1   LIVING ENVIRONMENT: Lives with: lives with  their family Lives in: House/apartment Stairs: No (2 steps to get into sunroom) Has following equipment at home: Single point cane and Walker - 2 wheeled  OCCUPATION: retired, Scientist, research (life sciences) estate  PLOF: Independent  PATIENT GOALS "I just want to be able to walk like I used to."   OBJECTIVE:   DIAGNOSTIC FINDINGS: XR foot 10/25/2021 Right foot radiographs obtained and interpreted today demonstrating no  acute findings.  No fractures, no subluxations.  PATIENT SURVEYS:  ABC scale 320/16 = 20% low level of physical functioning  (from eval)  LOWER EXTREMITY MMT:  MMT Right eval Left eval  Hip flexion 4 4  Hip extension 4 4+  Hip abduction 5 5  Hip adduction 5 5  Hip internal rotation    Hip external rotation    Knee flexion 5 5  Knee extension 5 5  Ankle dorsiflexion 5 5  Ankle plantarflexion 5 5  Ankle inversion    Ankle eversion     (Blank rows = not tested)    FUNCTIONAL TESTS:  From eval:  5 times sit to stand: 12 seconds without UE assist Dynamic Gait Index: 9/24 MCTSIB: Condition 1: Avg of 3 trials: 30 sec, Condition 2: Avg of 3 trials: 30 sec, Condition 3: Avg of 3 trials: 30 sec, Condition 4: Avg  of 3 trials: 3 sec, and Total Score: 93/120  On 01/04/22: 5 times sit to stand: 11 seconds without UE assist Dynamic gait index: 18/24 MCTSIB: condition 1: 30 sec; condition 2: 30 sec; condition 3: 30 sec; condition 4: 30 sec but with increased instability. Total score: 120/120   GAIT:   Gait speed = 2 ft/sec    ____________________________________________________  TODAY'S TREATMENT: 01/02/22  Nustep L6 x 5 min Les/UEs   Standing  4" forward step up/x10  6" forward step up x10  6" step up/down on platform x10  Backwards walking 4x20'   On airex   EC feet apart head nods and head turns 2 x30 sec   EC feet apart static standing 2x30 sec   EO partial tandem stance 2x30 sec    12/28/21  Nu step L6 x 5 min LEs/UEs for warm up   On airex   EO feet apart head  nods and head turns 2 x30 sec   EC feet apart head nods and head turns 2 x30 sec   EC feet apart static standing 2x30 sec   Marching 2x10    Heel raise 2x10  Standing   Step forward over/back over dowel 2x10 R & L   Four square stepping 5 CW & 5 CCW   Backwards walking      12/26/21   Nu step L6 LEs/UEs for warm up      Standing    Side stepping red TB 2x10    Forwards walking red TB 2x10 Backwards walking2x10 6" step up/down x4 reps 4" step up/down x4 reps    On airex    EC Feet apart static standing 2x30 sec    Neuro re-ed    Forwards walking stepping over 3 pool noodles x 8 reps    Gait x 4 laps with intermittent "fast"    Gait x 4 laps with L<>R and up/down head       PATIENT EDUCATION:  Education details: HEP updates. Discussed safety with balance exercises.  Person educated: Patient Education method: Explanation Education comprehension: verbalized understanding   HOME EXERCISE PROGRAM: Access Code: [email or web address] URL: [email or web address] Date: 12/28/2021 Prepared by: Estill Bamberg April [place] with Counter Support  - 1 x daily - 7 x weekly - 2 sets - 10 reps - Toe Raises with Counter Support  - 1 x daily - 7 x weekly - 2 sets - 10 reps - Standing Marching  - 1 x daily - 7 x weekly - 2 sets - 10 reps - Romberg Stance with Head Nods on Foam Pad  - 1 x daily - 7 x weekly - 2 sets - 10 reps - Romberg Stance on Foam Pad with Head Rotation  - 1 x daily - 7 x weekly - 2 sets - 10 reps - Side Stepping with Resistance at Ankles and Counter Support  - 1 x daily - 7 x weekly - 2 sets - 10 reps - Forward and Backward Monster Walk with Resistance at Ankles and Counter Support  - 1 x daily - 7 x weekly - 2 sets - 10 reps - Backwards Walking  - 1 x daily - 7 x weekly - 2 sets - 10 reps - Stepping in 4 Square Pattern  - 1 x daily - 7 x weekly - 2 sets - 10 reps  ASSESSMENT:  CLINICAL IMPRESSION: Pt with good compliance to her exercises.  Continued to work on foam and dynamic gait.  No LOB with eyes closed on foam demonstrating improvements with her mCTSIB.    OBJECTIVE IMPAIRMENTS Abnormal gait, decreased activity tolerance, decreased balance, decreased endurance, decreased mobility, difficulty walking, decreased ROM, decreased strength, and decreased safety awareness.   ACTIVITY LIMITATIONS carrying, lifting, standing, squatting, stairs, transfers, and locomotion level  PARTICIPATION LIMITATIONS: cleaning, laundry, driving, shopping, community activity, and yard work  PERSONAL FACTORS Age and 1-2 comorbidities:  osteoporosis, CAD, foot problems  are also affecting patient's functional outcome.   REHAB POTENTIAL: Good  CLINICAL DECISION MAKING: Evolving/moderate complexity  EVALUATION COMPLEXITY: Moderate  GOALS: Goals reviewed with patient? Yes  SHORT TERM GOALS: Target date: 12/06/2021   Patient will be independent with initial HEP. Baseline: no HEP Goal status: MET  2.  Patient will demonstrate decreased fall risk by scoring < 25 sec on TUG. Baseline: NT Goal status: INITIAL  3.  Patient will be educated on strategies to decrease risk of falls.  Baseline:  Goal status: MET   LONG TERM GOALS: Target date: 01/10/2022   Patient will be independent with advanced/ongoing HEP to improve outcomes and carryover.  Baseline:  Goal status: IN PROGRESS  2.  Patient will be able to ambulate 600' with LRAD with good safety to access community.  Baseline: Able to perform >600' with no a/d SBA for safety on 01/04/22 Goal status: IN PROGRESS  3.  Patient will be able to step up/down curb safely with LRAD for safety with community ambulation.  Baseline: Negotiates curb SBA on 01/04/22 Goal status: IN PROGRESS   4.  Patient will demonstrate improved functional LE strength as demonstrated by 5/5 LE strength. Baseline: see objective Goal status: IN PROGRESS  5.  Patient will demonstrate at least 19/24 on DGI to improve  gait stability and reduce risk for falls. Baseline: 18/24 on 9/21 Goal status: IN PROGRESS  6.  Patient will score >49/56 on Berg Balance test to demonstrate safety in ambulation without device .  Baseline: NT Goal status: IN PROGRESS  7.  Patient will report >40% on ABC scale to demonstrate improved functional ability. Baseline: 20% = low level physical functioning (from eval) Goal status: IN PROGRESS  8.  Patient will demonstrate gait speed of >/= 1.8 ft/sec (0.55 m/s) to be a safe limited community ambulator with decreased risk for recurrent falls.  Baseline: 2 ft/sec on 01/04/22 Goal status: IN PROGRESS    PLAN: PT FREQUENCY: 2x/week  PT DURATION: 8 weeks  PLANNED INTERVENTIONS: Therapeutic exercises, Therapeutic activity, Neuromuscular re-education, Balance training, Gait training, Patient/Family education, Self Care, Joint mobilization, Stair training, Cryotherapy, Moist heat, Manual therapy, and Re-evaluation  PLAN FOR NEXT SESSION: Re-assess Berg. Work on IT consultant, curbs, uneven surfaces. Progress HEP.   Lynetta Tomczak April Ma L Ladarius Seubert, PT, DPT  01/04/2022, 2:49 PM

## 2022-01-09 ENCOUNTER — Encounter: Admitting: Physical Therapy

## 2022-01-11 ENCOUNTER — Ambulatory Visit: Payer: Medicare Other | Admitting: Physical Therapy

## 2022-01-11 ENCOUNTER — Encounter: Payer: Self-pay | Admitting: Physical Therapy

## 2022-01-11 DIAGNOSIS — R296 Repeated falls: Secondary | ICD-10-CM

## 2022-01-11 DIAGNOSIS — M6281 Muscle weakness (generalized): Secondary | ICD-10-CM

## 2022-01-11 DIAGNOSIS — R262 Difficulty in walking, not elsewhere classified: Secondary | ICD-10-CM

## 2022-01-11 DIAGNOSIS — R2681 Unsteadiness on feet: Secondary | ICD-10-CM | POA: Diagnosis not present

## 2022-01-11 NOTE — Therapy (Signed)
OUTPATIENT PHYSICAL THERAPY TREATMENT   Patient Name: Toni Castaneda MRN: [id number] DOB:25-Dec-1933, 88 y.o., female Today's Date: 01/11/2022     PT End of Session - 01/11/22 1448     Visit Number 11    Number of Visits 16    Date for PT Re-Evaluation 01/10/22    Authorization Type [place] - Visit Number 11    Progress Note Due on Visit 10    PT Start Time 2094    PT Stop Time 1530    PT Time Calculation (min) 42 min    Activity Tolerance Patient tolerated treatment well    Behavior During Therapy WFL for tasks assessed/performed             Past Medical History:  Diagnosis Date   Anxiety    High cholesterol    Hypertension    MI (myocardial infarction) ([place])    Past Surgical History:  Procedure Laterality Date   ABDOMINAL HYSTERECTOMY     Cardiac stents     Patient Active Problem List   Diagnosis Date Noted   Left ankle injury 09/12/2012    PCP: Lilian Coma., MD  REFERRING PROVIDER: Jonathon Bellows, PA-C  REFERRING DIAG: [phone] (ICD-10-CM) - At risk for falling  THERAPY DIAG:  Unsteadiness on feet  Repeated falls  Difficulty in walking, not elsewhere classified  Muscle weakness (generalized)  Rationale for Evaluation and Treatment Rehabilitation  ONSET DATE: 10/13/2021  SUBJECTIVE:   SUBJECTIVE STATEMENT: Pt states she has been on the go all week. Pt reports a lot of fatigue. She thinks she may also had a UTI.   PERTINENT HISTORY: PMH: history MI, HTN, high cholesterol.  History of bil foot problems.    PAIN:  Are you having pain? No  PRECAUTIONS: Fall  WEIGHT BEARING RESTRICTIONS No  FALLS:  Has patient fallen in last 6 months? Yes. Number of falls 1   LIVING ENVIRONMENT: Lives with: lives with their family Lives in: House/apartment Stairs: No (2 steps to get into sunroom) Has following equipment at home: Single point cane and Walker - 2 wheeled  OCCUPATION: retired, Scientist, research (life sciences)  estate  PLOF: Independent  PATIENT GOALS "I just want to be able to walk like I used to."   OBJECTIVE:   DIAGNOSTIC FINDINGS: XR foot 10/25/2021 Right foot radiographs obtained and interpreted today demonstrating no  acute findings.  No fractures, no subluxations.  PATIENT SURVEYS:  ABC scale 320/16 = 20% low level of physical functioning  (from eval)  LOWER EXTREMITY MMT:  MMT Right eval Left eval  Hip flexion 4 4  Hip extension 4 4+  Hip abduction 5 5  Hip adduction 5 5  Hip internal rotation    Hip external rotation    Knee flexion 5 5  Knee extension 5 5  Ankle dorsiflexion 5 5  Ankle plantarflexion 5 5  Ankle inversion    Ankle eversion     (Blank rows = not tested)    FUNCTIONAL TESTS:  From eval:  5 times sit to stand: 12 seconds without UE assist Dynamic Gait Index: 9/24 MCTSIB: Condition 1: Avg of 3 trials: 30 sec, Condition 2: Avg of 3 trials: 30 sec, Condition 3: Avg of 3 trials: 30 sec, Condition 4: Avg of 3 trials: 3 sec, and Total Score: 93/120  On 01/04/22: 5 times sit to stand: 11 seconds without UE assist Dynamic gait index: 18/24 MCTSIB: condition 1: 30 sec; condition 2: 30 sec;  condition 3: 30 sec; condition 4: 30 sec but with increased instability. Total score: 120/120   [facility] PT Assessment - 01/11/22 0001       Berg Balance Test   Sit to Stand Able to stand without using hands and stabilize independently    Standing Unsupported Able to stand safely 2 minutes    Sitting with Back Unsupported but Feet Supported on Floor or Stool Able to sit safely and securely 2 minutes    Stand to Sit Sits safely with minimal use of hands    Transfers Able to transfer safely, minor use of hands    Standing Unsupported with Eyes Closed Able to stand 10 seconds safely    Standing Unsupported with Feet Together Able to place feet together independently and stand 1 minute safely    From Standing, Reach Forward with Outstretched Arm Can reach confidently >25 cm  (10")    From Standing Position, Pick up Object from Floor Able to pick up shoe safely and easily    From Standing Position, Turn to Look Behind Over each Shoulder Looks behind from both sides and weight shifts well    Turn 360 Degrees Able to turn 360 degrees safely in 4 seconds or less    Standing Unsupported, Alternately Place Feet on Step/Stool Able to stand independently and safely and complete 8 steps in 20 seconds    Standing Unsupported, One Foot in Front Able to plae foot ahead of the other independently and hold 30 seconds    Standing on One Leg Tries to lift leg/unable to hold 3 seconds but remains standing independently    Total Score 52              GAIT:   Gait speed = 2 ft/sec    ____________________________________________________  TODAY'S TREATMENT: 01/11/22  Nustep L6 x 5 min LEs/UEs  Standing   Forward reach >10" 5 cones x 2 L&R UE   Side reach >10" 5 cones x 2 L&R UE   Forward reach on to floor 5 cones x2 L&R UE   Tandem stance 2x30 sec L&R     Standing on airex   Body turns 2x10   Alternate foot taps on 6" step 2x10 (one finger support)     01/02/22  Nustep L6 x 5 min Les/UEs   Standing  4" forward step up/x10  6" forward step up x10  6" step up/down on platform x10  Backwards walking 4x20'   On airex   EC feet apart head nods and head turns 2 x30 sec   EC feet apart static standing 2x30 sec   EO partial tandem stance 2x30 sec    12/28/21  Nu step L6 x 5 min LEs/UEs for warm up   On airex   EO feet apart head nods and head turns 2 x30 sec   EC feet apart head nods and head turns 2 x30 sec   EC feet apart static standing 2x30 sec   Marching 2x10    Heel raise 2x10  Standing   Step forward over/back over dowel 2x10 R & L   Four square stepping 5 CW & 5 CCW   Backwards walking      12/26/21   Nu step L6 LEs/UEs for warm up      Standing    Side stepping red TB 2x10    Forwards walking red TB 2x10 Backwards walking2x10 6" step  up/down x4 reps 4" step up/down x4 reps  On airex    EC Feet apart static standing 2x30 sec    Neuro re-ed    Forwards walking stepping over 3 pool noodles x 8 reps    Gait x 4 laps with intermittent "fast"    Gait x 4 laps with L<>R and up/down head       PATIENT EDUCATION:  Education details: HEP updates. Discussed safety with balance exercises.  Person educated: Patient Education method: Explanation Education comprehension: verbalized understanding   HOME EXERCISE PROGRAM: Access Code: [email or web address] URL: [email or web address] Date: 12/28/2021 Prepared by: Estill Bamberg April [place] with Counter Support  - 1 x daily - 7 x weekly - 2 sets - 10 reps - Toe Raises with Counter Support  - 1 x daily - 7 x weekly - 2 sets - 10 reps - Standing Marching  - 1 x daily - 7 x weekly - 2 sets - 10 reps - Romberg Stance with Head Nods on Foam Pad  - 1 x daily - 7 x weekly - 2 sets - 10 reps - Romberg Stance on Foam Pad with Head Rotation  - 1 x daily - 7 x weekly - 2 sets - 10 reps - Side Stepping with Resistance at Ankles and Counter Support  - 1 x daily - 7 x weekly - 2 sets - 10 reps - Forward and Backward Monster Walk with Resistance at Ankles and Counter Support  - 1 x daily - 7 x weekly - 2 sets - 10 reps - Backwards Walking  - 1 x daily - 7 x weekly - 2 sets - 10 reps - Stepping in 4 Square Pattern  - 1 x daily - 7 x weekly - 2 sets - 10 reps  ASSESSMENT:  CLINICAL IMPRESSION: Pt with good compliance to her exercises. Continued to work on foam and dynamic gait. No LOB with eyes closed on foam demonstrating improvements with her mCTSIB.    OBJECTIVE IMPAIRMENTS Abnormal gait, decreased activity tolerance, decreased balance, decreased endurance, decreased mobility, difficulty walking, decreased ROM, decreased strength, and decreased safety awareness.   ACTIVITY LIMITATIONS carrying, lifting, standing, squatting, stairs, transfers, and locomotion  level  PARTICIPATION LIMITATIONS: cleaning, laundry, driving, shopping, community activity, and yard work  PERSONAL FACTORS Age and 1-2 comorbidities:  osteoporosis, CAD, foot problems  are also affecting patient's functional outcome.   REHAB POTENTIAL: Good  CLINICAL DECISION MAKING: Evolving/moderate complexity  EVALUATION COMPLEXITY: Moderate  GOALS: Goals reviewed with patient? Yes  SHORT TERM GOALS: Target date: 12/06/2021   Patient will be independent with initial HEP. Baseline: no HEP Goal status: MET  2.  Patient will demonstrate decreased fall risk by scoring < 25 sec on TUG. Baseline: NT Goal status: INITIAL  3.  Patient will be educated on strategies to decrease risk of falls.  Baseline:  Goal status: MET   LONG TERM GOALS: Target date: 01/10/2022   Patient will be independent with advanced/ongoing HEP to improve outcomes and carryover.  Baseline:  Goal status: IN PROGRESS  2.  Patient will be able to ambulate 600' with LRAD with good safety to access community.  Baseline: Able to perform >600' with no a/d SBA for safety on 01/04/22 Goal status: IN PROGRESS  3.  Patient will be able to step up/down curb safely with LRAD for safety with community ambulation.  Baseline: Negotiates curb SBA on 01/04/22 Goal status: IN PROGRESS   4.  Patient will demonstrate improved functional LE strength as demonstrated by 5/5 LE  strength. Baseline: see objective Goal status: IN PROGRESS  5.  Patient will demonstrate at least 19/24 on DGI to improve gait stability and reduce risk for falls. Baseline: 18/24 on 9/21 Goal status: IN PROGRESS  6.  Patient will score >49/56 on Berg Balance test to demonstrate safety in ambulation without device .  Baseline: NT Goal status: IN PROGRESS  7.  Patient will report >40% on ABC scale to demonstrate improved functional ability. Baseline: 20% = low level physical functioning (from eval) Goal status: IN PROGRESS  8.  Patient will  demonstrate gait speed of >/= 1.8 ft/sec (0.55 m/s) to be a safe limited community ambulator with decreased risk for recurrent falls.  Baseline: 2 ft/sec on 01/04/22 Goal status: IN PROGRESS    PLAN: PT FREQUENCY: 2x/week  PT DURATION: 8 weeks  PLANNED INTERVENTIONS: Therapeutic exercises, Therapeutic activity, Neuromuscular re-education, Balance training, Gait training, Patient/Family education, Self Care, Joint mobilization, Stair training, Cryotherapy, Moist heat, Manual therapy, and Re-evaluation  PLAN FOR NEXT SESSION: Re-assess Berg. Work on IT consultant, curbs, uneven surfaces. Progress HEP.   Astria Jordahl April Ma L Teo Moede, PT, DPT  01/11/2022, 2:48 PM

## 2022-01-18 ENCOUNTER — Ambulatory Visit: Payer: Medicare Other | Admitting: Physical Therapy

## 2022-01-25 ENCOUNTER — Ambulatory Visit: Payer: Medicare Other | Attending: Physician Assistant | Admitting: Physical Therapy

## 2022-01-25 ENCOUNTER — Encounter: Payer: Self-pay | Admitting: Physical Therapy

## 2022-01-25 DIAGNOSIS — R262 Difficulty in walking, not elsewhere classified: Secondary | ICD-10-CM | POA: Insufficient documentation

## 2022-01-25 DIAGNOSIS — R296 Repeated falls: Secondary | ICD-10-CM | POA: Insufficient documentation

## 2022-01-25 DIAGNOSIS — M6281 Muscle weakness (generalized): Secondary | ICD-10-CM | POA: Diagnosis present

## 2022-01-25 DIAGNOSIS — R2681 Unsteadiness on feet: Secondary | ICD-10-CM | POA: Diagnosis present

## 2022-01-25 NOTE — Therapy (Signed)
OUTPATIENT PHYSICAL THERAPY TREATMENT AND DISCHARGE   Patient Name: Toni Castaneda MRN: [id number] DOB:21-Sep-1933, 88 y.o., female Today's Date: 01/25/2022  PHYSICAL THERAPY DISCHARGE SUMMARY  Visits from Start of Care: 12  Current functional level related to goals / functional outcomes: See below   Remaining deficits: See below   Education / Equipment: See below   Patient agrees to discharge. Patient goals were met. Patient is being discharged due to meeting the stated rehab goals.    PT End of Session - 01/25/22 1442     Visit Number 12    Number of Visits 16    Date for PT Re-Evaluation 01/10/22    Authorization Type [facility] - Visit Number 12    Progress Note Due on Visit 10    PT Start Time 0347    PT Stop Time 1530    PT Time Calculation (min) 45 min    Activity Tolerance Patient tolerated treatment well    Behavior During Therapy WFL for tasks assessed/performed             Past Medical History:  Diagnosis Date   Anxiety    High cholesterol    Hypertension    MI (myocardial infarction) ([place])    Past Surgical History:  Procedure Laterality Date   ABDOMINAL HYSTERECTOMY     Cardiac stents     Patient Active Problem List   Diagnosis Date Noted   Left ankle injury 09/12/2012    PCP: Lilian Coma., MD  REFERRING PROVIDER: Jonathon Bellows, PA-C  REFERRING DIAG: 7264125364 (ICD-10-CM) - At risk for falling  THERAPY DIAG:  Unsteadiness on feet  Repeated falls  Difficulty in walking, not elsewhere classified  Muscle weakness (generalized)  Rationale for Evaluation and Treatment Rehabilitation  ONSET DATE: 10/13/2021  SUBJECTIVE:   SUBJECTIVE STATEMENT: Pt states last week she has been sick. Pt reports that she finally went Monday to get checked for UTI. Pt states that her stomach was not feeling well earlier this week.   PERTINENT HISTORY: PMH: history MI, HTN, high cholesterol.  History of bil  foot problems.    PAIN:  Are you having pain? No  PRECAUTIONS: Fall  WEIGHT BEARING RESTRICTIONS No  FALLS:  Has patient fallen in last 6 months? Yes. Number of falls 1   LIVING ENVIRONMENT: Lives with: lives with their family Lives in: House/apartment Stairs: No (2 steps to get into sunroom) Has following equipment at home: Single point cane and Walker - 2 wheeled  OCCUPATION: retired, Scientist, research (life sciences) estate  PLOF: Independent  PATIENT GOALS "I just want to be able to walk like I used to."   OBJECTIVE:   DIAGNOSTIC FINDINGS: XR foot 10/25/2021 Right foot radiographs obtained and interpreted today demonstrating no  acute findings.  No fractures, no subluxations.  PATIENT SURVEYS:  ABC scale 320/16 = 20% low level of physical functioning  (from eval) ABC scale 1295/16 = 81% (01/25/22)  LOWER EXTREMITY MMT:  MMT Right eval Left eval Right 10/12 Left 10/12  Hip flexion $RemoveBef'4 4 5 5  'BleKWTijgc$ Hip extension 4 4+ 4 4+  Hip abduction 5 5    Hip adduction 5 5    Hip internal rotation      Hip external rotation      Knee flexion 5 5    Knee extension 5 5    Ankle dorsiflexion 5 5    Ankle plantarflexion 5 5    Ankle inversion  Ankle eversion       (Blank rows = not tested)    FUNCTIONAL TESTS:  From eval:  5 times sit to stand: 12 seconds without UE assist Dynamic Gait Index: 9/24 MCTSIB: Condition 1: Avg of 3 trials: 30 sec, Condition 2: Avg of 3 trials: 30 sec, Condition 3: Avg of 3 trials: 30 sec, Condition 4: Avg of 3 trials: 3 sec, and Total Score: 93/120  On 01/04/22: 5 times sit to stand: 11 seconds without UE assist Dynamic gait index: 18/24 MCTSIB: condition 1: 30 sec; condition 2: 30 sec; condition 3: 30 sec; condition 4: 30 sec but with increased instability. Total score: 120/120  On 9/28:  Berg Balance 52/56  On 10/12:      [facility] PT Assessment - 01/25/22 0001       Dynamic Gait Index   Level Surface Mild Impairment    Change in Gait Speed Mild Impairment     Gait with Horizontal Head Turns Normal    Gait with Vertical Head Turns Normal    Gait and Pivot Turn Normal    Step Over Obstacle Normal    Step Around Obstacles Normal    Steps Mild Impairment    Total Score 21               GAIT:   Gait speed = 2 ft/sec    ____________________________________________________  TODAY'S TREATMENT: 01/11/22  Nustep L6 x 5 min LEs/UEs  Standing   Forward reach >10" 5 cones x 2 L&R UE   Side reach >10" 5 cones x 2 L&R UE   Forward reach on to floor 5 cones x2 L&R UE   Tandem stance 2x30 sec L&R     Standing on airex   Body turns 2x10   Alternate foot taps on 6" step 2x10 (one finger support)     01/02/22  Nustep L6 x 5 min Les/UEs   Standing  4" forward step up/x10  6" forward step up x10  6" step up/down on platform x10  Backwards walking 4x20'   On airex   EC feet apart head nods and head turns 2 x30 sec   EC feet apart static standing 2x30 sec   EO partial tandem stance 2x30 sec    12/28/21  Nu step L6 x 5 min LEs/UEs for warm up   On airex   EO feet apart head nods and head turns 2 x30 sec   EC feet apart head nods and head turns 2 x30 sec   EC feet apart static standing 2x30 sec   Marching 2x10    Heel raise 2x10  Standing   Step forward over/back over dowel 2x10 R & L   Four square stepping 5 CW & 5 CCW   Backwards walking          PATIENT EDUCATION:  Education details: HEP updates. Discussed safety with balance exercises.  Person educated: Patient Education method: Explanation Education comprehension: verbalized understanding   HOME EXERCISE PROGRAM: Access Code: [email or web address] URL: [email or web address] Date: 12/28/2021 Prepared by: Estill Bamberg April [place] with Counter Support  - 1 x daily - 7 x weekly - 2 sets - 10 reps - Toe Raises with Counter Support  - 1 x daily - 7 x weekly - 2 sets - 10 reps - Standing Marching  - 1 x daily - 7 x weekly - 2 sets - 10 reps -  Romberg Stance with Head Nods on Foam Pad  -  1 x daily - 7 x weekly - 2 sets - 10 reps - Romberg Stance on Foam Pad with Head Rotation  - 1 x daily - 7 x weekly - 2 sets - 10 reps - Side Stepping with Resistance at Ankles and Counter Support  - 1 x daily - 7 x weekly - 2 sets - 10 reps - Forward and Backward Monster Walk with Resistance at Ankles and Counter Support  - 1 x daily - 7 x weekly - 2 sets - 10 reps - Backwards Walking  - 1 x daily - 7 x weekly - 2 sets - 10 reps - Stepping in 4 Square Pattern  - 1 x daily - 7 x weekly - 2 sets - 10 reps  ASSESSMENT:  CLINICAL IMPRESSION: Pt has demonstrated great gains with her balance. Pt has met all of her LTGs except for strength. Hip extension remains the weakest. Discussed with pt her continued deficits with hip strength, single leg balance and tandem stance. Pt is knowledgeable in continuing these exercises at home. Pt feels ready for PT d/c.    OBJECTIVE IMPAIRMENTS Abnormal gait, decreased activity tolerance, decreased balance, decreased endurance, decreased mobility, difficulty walking, decreased ROM, decreased strength, and decreased safety awareness.   ACTIVITY LIMITATIONS carrying, lifting, standing, squatting, stairs, transfers, and locomotion level  PARTICIPATION LIMITATIONS: cleaning, laundry, driving, shopping, community activity, and yard work  PERSONAL FACTORS Age and 1-2 comorbidities:  osteoporosis, CAD, foot problems  are also affecting patient's functional outcome.   REHAB POTENTIAL: Good  CLINICAL DECISION MAKING: Evolving/moderate complexity  EVALUATION COMPLEXITY: Moderate  GOALS: Goals reviewed with patient? Yes  SHORT TERM GOALS: Target date: 12/06/2021   Patient will be independent with initial HEP. Baseline: no HEP Goal status: MET  2.  Patient will demonstrate decreased fall risk by scoring < 25 sec on TUG. Baseline: NT Goal status: MET  3.  Patient will be educated on strategies to decrease risk of  falls.  Baseline:  Goal status: MET   LONG TERM GOALS: Target date: 01/10/2022   Patient will be independent with advanced/ongoing HEP to improve outcomes and carryover.  Baseline:  Goal status: MET  2.  Patient will be able to ambulate 600' with LRAD with good safety to access community.  Baseline: Able to perform >600' with no a/d SBA for safety on 01/04/22 Goal status: MET  3.  Patient will be able to step up/down curb safely with LRAD for safety with community ambulation.  Baseline: Negotiates curb ind on 01/25/22 Goal status: MET   4.  Patient will demonstrate improved functional LE strength as demonstrated by 5/5 LE strength. Baseline: see objective Goal status: PARTIALLY MET  5.  Patient will demonstrate at least 19/24 on DGI to improve gait stability and reduce risk for falls. Baseline: 21/24 on 01/25/22 Goal status: MET  6.  Patient will score >49/56 on Berg Balance test to demonstrate safety in ambulation without device .  Baseline: 52/56 on 01/11/22 Goal status: MET  7.  Patient will report >40% on ABC scale to demonstrate improved functional ability. Baseline: 81% = higher level of functioning 01/25/22 Goal status: MET  8.  Patient will demonstrate gait speed of >/= 1.8 ft/sec (0.55 m/s) to be a safe limited community ambulator with decreased risk for recurrent falls.  Baseline: 2 ft/sec on 01/04/22 Goal status: MET    PLAN: PT FREQUENCY: 2x/week  PT DURATION: 8 weeks  PLANNED INTERVENTIONS: Therapeutic exercises, Therapeutic activity, Neuromuscular re-education, Balance training, Gait training,  Patient/Family education, Self Care, Joint mobilization, Stair training, Cryotherapy, Moist heat, Manual therapy, and Re-evaluation  PLAN FOR NEXT SESSION: Re-assess Berg. Work on IT consultant, curbs, uneven surfaces. Progress HEP.   Arleatha Philipps April Gordy Levan, PT, DPT  01/25/2022, 2:43 PM

## 2022-04-06 ENCOUNTER — Ambulatory Visit
Admission: EM | Admit: 2022-04-06 | Discharge: 2022-04-06 | Disposition: A | Discharge status: Home / Self Care | Payer: Medicare Other | Attending: Urgent Care | Admitting: Urgent Care

## 2022-04-06 ENCOUNTER — Encounter: Payer: Self-pay | Admitting: Emergency Medicine

## 2022-04-06 DIAGNOSIS — R0981 Nasal congestion: Secondary | ICD-10-CM | POA: Diagnosis not present

## 2022-04-06 DIAGNOSIS — J069 Acute upper respiratory infection, unspecified: Secondary | ICD-10-CM

## 2022-04-06 MED ORDER — IPRATROPIUM BROMIDE 0.03 % NA SOLN: 2.0000 | Freq: Two times a day (BID) | NASAL | 0 refills | Status: AC | PRN

## 2022-04-06 MED ORDER — GUAIFENESIN ER 600 MG PO TB12: 600.0000 mg | ORAL_TABLET | Freq: Two times a day (BID) | ORAL | 0 refills | Status: AC | PRN

## 2022-04-06 MED ORDER — BENZONATATE 100 MG PO CAPS: 100.0000 mg | ORAL_CAPSULE | Freq: Two times a day (BID) | ORAL | 0 refills | Status: AC | PRN

## 2022-04-06 NOTE — ED Provider Notes (Signed)
[facility]    CSN: [id number] Arrival date & time: 04/06/22  1423      History   Chief Complaint Chief Complaint  Patient presents with   Cough    HPI Toni Castaneda is a 88 y.o. female.   88 year old female presents today on the third day of sinus pain, ear pressure, and drainage.  States she is prone to these type of things, and it started after being exposed to cold windy air several days ago.  Reports she is also prone to bronchitis and is concerned that if not treated appropriately, it will turn into bronchitis.  She does report a very intermittent dry cough.  States she took a "over-the-counter XL medication", which helped improve her symptoms dramatically.  She denies a fever or tooth sensitivity.  Feel feels that her hearing is slightly decreased bilaterally.  States her PCP usually gives her a Z-Pak.   Cough   Past Medical History:  Diagnosis Date   Anxiety    High cholesterol    Hypertension    MI (myocardial infarction) [facility])     Patient Active Problem List   Diagnosis Date Noted   Left ankle injury 09/12/2012    Past Surgical History:  Procedure Laterality Date   ABDOMINAL HYSTERECTOMY     Cardiac stents      OB History   No obstetric history on file.      Home Medications    Prior to Admission medications   Medication Sig Start Date End Date Taking? Authorizing Provider  amLODipine (NORVASC) 2.5 MG tablet Take 2.5 mg by mouth daily.   Yes [provider]  aspirin 81 MG tablet Take 81 mg by mouth daily.   Yes [provider]  benzonatate (TESSALON) 100 MG capsule Take 1 capsule (100 mg total) by mouth 2 (two) times daily as needed for cough. 04/06/22  Yes Avigail Pilling L, PA  cetirizine (ZYRTEC) 10 MG tablet Take 10 mg by mouth daily.   Yes [provider]  citalopram (CELEXA) 10 MG tablet  09/02/12  Yes [provider]  diphenoxylate-atropine (LOMOTIL) 2.5-0.025 MG per tablet Take 1 tablet by  mouth 4 (four) times daily as needed.   Yes [provider]  guaiFENesin (MUCINEX) 600 MG 12 hr tablet Take 1 tablet (600 mg total) by mouth 2 (two) times daily as needed for cough or to loosen phlegm. 04/06/22  Yes Mala Gibbard L, PA  ipratropium (ATROVENT) 0.03 % nasal spray Place 2 sprays into both nostrils every 12 (twelve) hours as needed for rhinitis. 04/06/22  Yes Carlon Davidson L, PA  lisinopril (PRINIVIL,ZESTRIL) 40 MG tablet Take 40 mg by mouth daily. Patient uses 20 mg in the am, and 20 mg in the pm.   Yes [provider]  LORazepam (ATIVAN) 1 MG tablet Take 1 mg by mouth every 8 (eight) hours.   Yes [provider]  metoprolol tartrate (LOPRESSOR) 25 MG tablet Take 25 mg by mouth 2 (two) times daily.   Yes [provider]  pantoprazole (PROTONIX) 40 MG tablet Take 40 mg by mouth daily.   Yes [provider]  PREMARIN vaginal cream  06/11/12  Yes [provider]  rosuvastatin (CRESTOR) 10 MG tablet Take 10 mg by mouth daily.   Yes [provider]    Family History History reviewed. No pertinent family history.  Social History Social History   Tobacco Use   Smoking status: Never   Smokeless tobacco: Never  Vaping Use   Vaping Use: Never used  Substance Use Topics   Alcohol use: No   Drug use: No     Allergies   Amoxicillin, Codeine, Penicillins, Sulfa antibiotics, and Tetracyclines & related   Review of Systems Review of Systems  Respiratory:  Positive for cough.   As per HPI   Physical Exam Triage Vital Signs ED Triage Vitals  Enc Vitals Group     BP 04/06/22 1503 (!) 164/80     Pulse Rate 04/06/22 1503 65     Resp 04/06/22 1503 18     Temp 04/06/22 1503 98.2 F (36.8 C)     Temp Source 04/06/22 1503 Oral     SpO2 04/06/22 1503 96 %     Weight 04/06/22 1504 117 lb (53.1 kg)     Height 04/06/22 1504 5\' 2"  (1.575 m)     Head Circumference --      Peak Flow --      Pain Score 04/06/22 1504 3      Pain Loc --      Pain Edu? --      Excl. in GC? --    No data found.  Updated Vital Signs BP (!) 164/80 (BP Location: Left Arm)   Pulse 65   Temp 98.2 F (36.8 C) (Oral)   Resp 18   Ht 5\' 2"  (1.575 m)   Wt 117 lb (53.1 kg)   SpO2 96%   BMI 21.40 kg/m   Visual Acuity Right Eye Distance:   Left Eye Distance:   Bilateral Distance:    Right Eye Near:   Left Eye Near:    Bilateral Near:     Physical Exam Vitals and nursing note reviewed. Exam conducted with a chaperone present.  Constitutional:      General: She is not in acute distress.    Appearance: Normal appearance. She is well-developed and normal weight. She is not ill-appearing, toxic-appearing or diaphoretic.  HENT:     Head: Normocephalic and atraumatic.     Right Ear: Tympanic membrane, ear canal and external ear normal. There is no impacted cerumen.     Left Ear: Tympanic membrane, ear canal and external ear normal. There is no impacted cerumen.     Nose: Congestion present. No rhinorrhea.     Mouth/Throat:     Mouth: Mucous membranes are moist.     Pharynx: Oropharynx is clear. No oropharyngeal exudate or posterior oropharyngeal erythema.  Eyes:     General: No scleral icterus.       Right eye: No discharge.        Left eye: No discharge.     Extraocular Movements: Extraocular movements intact.     Conjunctiva/sclera: Conjunctivae normal.     Pupils: Pupils are equal, round, and reactive to light.  Cardiovascular:     Rate and Rhythm: Normal rate and regular rhythm.     Pulses: Normal pulses.     Heart sounds: Normal heart sounds. No murmur heard.    No gallop.  Pulmonary:     Effort: Pulmonary effort is normal. No respiratory distress.     Breath sounds: Normal breath sounds. No stridor. No wheezing, rhonchi or rales.  Chest:     Chest wall: No tenderness.  Abdominal:     Palpations: Abdomen is soft.     Tenderness: There is no abdominal tenderness.  Musculoskeletal:        General: No  swelling or tenderness. Normal range of motion.  Cervical back: Normal range of motion and neck supple. No rigidity or tenderness.     Right lower leg: No edema.     Left lower leg: No edema.  Lymphadenopathy:     Cervical: No cervical adenopathy.  Skin:    General: Skin is warm and dry.     Capillary Refill: Capillary refill takes less than 2 seconds.  Neurological:     Mental Status: She is alert.  Psychiatric:        Mood and Affect: Mood normal.      UC Treatments / Results  Labs (all labs ordered are listed, but only abnormal results are displayed) Labs Reviewed - No data to display  EKG   Radiology No results found.  Procedures Procedures (including critical care time)  Medications Ordered in UC Medications - No data to display  Initial Impression / Assessment and Plan / UC Course  I have reviewed the triage vital signs and the nursing notes.  Pertinent labs & imaging results that were available during my care of the patient were reviewed by me and considered in my medical decision making (see chart for details).     Viral URI with cough -symptoms mild, improved with over-the-counter medications.  Will encourage use of plain Mucinex with plenty of water, trial of Flonase.  As needed benzonatate as needed. Sinus congestion -there is no indication for bacterial sinusitis.  Additionally patient requesting Z-Pak, this is contraindicated with her current use of Celexa.  Would recommend humidification and Flonase and/or Atrovent nasal spray to help combat the symptoms.  Final Clinical Impressions(s) / UC Diagnoses   Final diagnoses:  Viral URI with cough  Sinus congestion     Discharge Instructions      You have what appears to be a viral sinus infection. Please use Mucinex twice daily with plenty of water. Purchase over-the-counter Flonase and use 1 spray daily.  If ineffective, start using the Atrovent nasal spray called in today. Use the benzonatate  cough medication on an as-needed basis. You may continue to use your Zyrtec. Symptoms should improve within the next 7 to 10 days. If you develop a fever, please return to clinic for recheck.     ED Prescriptions     Medication Sig Dispense Auth. Provider   guaiFENesin (MUCINEX) 600 MG 12 hr tablet Take 1 tablet (600 mg total) by mouth 2 (two) times daily as needed for cough or to loosen phlegm. 20 tablet Lennan Malone L, PA   ipratropium (ATROVENT) 0.03 % nasal spray Place 2 sprays into both nostrils every 12 (twelve) hours as needed for rhinitis. 30 mL Cordell Coke L, PA   benzonatate (TESSALON) 100 MG capsule Take 1 capsule (100 mg total) by mouth 2 (two) times daily as needed for cough. 20 capsule Felise [place] L, PA      PDMP not reviewed this encounter.   Maretta Bees, [place] 04/06/22 2056

## 2022-04-06 NOTE — Discharge Instructions (Signed)
You have what appears to be a viral sinus infection. Please use Mucinex twice daily with plenty of water. Purchase over-the-counter Flonase and use 1 spray daily.  If ineffective, start using the Atrovent nasal spray called in today. Use the benzonatate cough medication on an as-needed basis. You may continue to use your Zyrtec. Symptoms should improve within the next 7 to 10 days. If you develop a fever, please return to clinic for recheck.

## 2022-04-06 NOTE — ED Triage Notes (Signed)
Patient c/o head pressure, congestion, sinus pain, facial pain, bilateral ear pressure x 3 days.  Patient is afebrile.  Patient has taken OTC cold meds.
